# Patient Record
Sex: Female | Born: 1935 | Race: White | Hispanic: No | Marital: Married
Health system: Southern US, Community
[De-identification: ages and names within clinical notes are randomized; demographics above are authoritative.]

## PROBLEM LIST (undated history)

## (undated) DIAGNOSIS — R51 Headache: Secondary | ICD-10-CM

## (undated) DIAGNOSIS — E7800 Pure hypercholesterolemia, unspecified: Secondary | ICD-10-CM

## (undated) DIAGNOSIS — J302 Other seasonal allergic rhinitis: Secondary | ICD-10-CM

## (undated) DIAGNOSIS — Z973 Presence of spectacles and contact lenses: Secondary | ICD-10-CM

## (undated) DIAGNOSIS — C801 Malignant (primary) neoplasm, unspecified: Secondary | ICD-10-CM

## (undated) DIAGNOSIS — I10 Essential (primary) hypertension: Secondary | ICD-10-CM

## (undated) DIAGNOSIS — R519 Headache, unspecified: Secondary | ICD-10-CM

## (undated) DIAGNOSIS — M353 Polymyalgia rheumatica: Secondary | ICD-10-CM

## (undated) DIAGNOSIS — N189 Chronic kidney disease, unspecified: Secondary | ICD-10-CM

## (undated) DIAGNOSIS — H409 Unspecified glaucoma: Secondary | ICD-10-CM

## (undated) HISTORY — PX: JOINT REPLACEMENT: SHX530

## (undated) HISTORY — PX: FRACTURE SURGERY: SHX138

## (undated) HISTORY — PX: ABDOMINAL HYSTERECTOMY: SHX81

## (undated) HISTORY — PX: CATARACT EXTRACTION W/ INTRAOCULAR LENS  IMPLANT, BILATERAL: SHX1307

## (undated) HISTORY — PX: DILATION AND CURETTAGE OF UTERUS: SHX78

## (undated) HISTORY — PX: BREAST SURGERY: SHX581

## (undated) HISTORY — PX: CHOLECYSTECTOMY: SHX55

---

## 2005-02-27 ENCOUNTER — Ambulatory Visit: Payer: Self-pay | Admitting: Family Medicine

## 2005-06-10 ENCOUNTER — Ambulatory Visit: Payer: Self-pay | Admitting: Family Medicine

## 2005-09-04 ENCOUNTER — Ambulatory Visit: Payer: Self-pay | Admitting: Family Medicine

## 2014-07-19 ENCOUNTER — Other Ambulatory Visit (HOSPITAL_COMMUNITY): Payer: Self-pay | Admitting: Neurosurgery

## 2014-07-25 ENCOUNTER — Encounter (HOSPITAL_COMMUNITY): Payer: Self-pay | Admitting: Pharmacy Technician

## 2014-07-28 ENCOUNTER — Other Ambulatory Visit (HOSPITAL_COMMUNITY): Payer: Self-pay | Admitting: Neurosurgery

## 2014-07-28 ENCOUNTER — Encounter (HOSPITAL_COMMUNITY)
Admission: RE | Admit: 2014-07-28 | Discharge: 2014-07-28 | Disposition: A | Discharge status: Home / Self Care | Payer: Medicare PPO | Source: Ambulatory Visit | Attending: Neurosurgery | Admitting: Neurosurgery

## 2014-07-28 ENCOUNTER — Encounter (HOSPITAL_COMMUNITY): Payer: Self-pay

## 2014-07-28 DIAGNOSIS — I10 Essential (primary) hypertension: Secondary | ICD-10-CM | POA: Insufficient documentation

## 2014-07-28 DIAGNOSIS — C50919 Malignant neoplasm of unspecified site of unspecified female breast: Secondary | ICD-10-CM | POA: Diagnosis not present

## 2014-07-28 DIAGNOSIS — E780 Pure hypercholesterolemia: Secondary | ICD-10-CM | POA: Diagnosis not present

## 2014-07-28 DIAGNOSIS — H409 Unspecified glaucoma: Secondary | ICD-10-CM | POA: Diagnosis not present

## 2014-07-28 DIAGNOSIS — M353 Polymyalgia rheumatica: Secondary | ICD-10-CM | POA: Insufficient documentation

## 2014-07-28 DIAGNOSIS — N183 Chronic kidney disease, stage 3 (moderate): Secondary | ICD-10-CM | POA: Insufficient documentation

## 2014-07-28 DIAGNOSIS — Z01818 Encounter for other preprocedural examination: Secondary | ICD-10-CM | POA: Insufficient documentation

## 2014-07-28 HISTORY — DX: Malignant (primary) neoplasm, unspecified: C80.1

## 2014-07-28 HISTORY — DX: Chronic kidney disease, unspecified: N18.9

## 2014-07-28 HISTORY — DX: Other seasonal allergic rhinitis: J30.2

## 2014-07-28 HISTORY — DX: Essential (primary) hypertension: I10

## 2014-07-28 HISTORY — DX: Pure hypercholesterolemia, unspecified: E78.00

## 2014-07-28 HISTORY — DX: Presence of spectacles and contact lenses: Z97.3

## 2014-07-28 HISTORY — DX: Headache: R51

## 2014-07-28 HISTORY — DX: Headache, unspecified: R51.9

## 2014-07-28 HISTORY — DX: Unspecified glaucoma: H40.9

## 2014-07-28 HISTORY — DX: Polymyalgia rheumatica: M35.3

## 2014-07-28 LAB — SURGICAL PCR SCREEN
MRSA, PCR: POSITIVE — AB
Staphylococcus aureus: POSITIVE — AB

## 2014-07-28 LAB — BASIC METABOLIC PANEL
Anion gap: 12 (ref 5–15)
BUN: 26 mg/dL — AB (ref 6–23)
CALCIUM: 9.1 mg/dL (ref 8.4–10.5)
CO2: 26 mEq/L (ref 19–32)
Chloride: 102 mEq/L (ref 96–112)
Creatinine, Ser: 1.74 mg/dL — ABNORMAL HIGH (ref 0.50–1.10)
GFR calc Af Amer: 30 mL/min — ABNORMAL LOW (ref >90)
GFR calc non Af Amer: 26 mL/min — ABNORMAL LOW (ref >90)
GLUCOSE: 143 mg/dL — AB (ref 70–99)
Potassium: 4.8 mEq/L (ref 3.7–5.3)
Sodium: 140 mEq/L (ref 137–147)

## 2014-07-28 LAB — CBC
HEMATOCRIT: 36.4 % (ref 36.0–46.0)
Hemoglobin: 12 g/dL (ref 12.0–15.0)
MCH: 31.6 pg (ref 26.0–34.0)
MCHC: 33 g/dL (ref 30.0–36.0)
MCV: 95.8 fL (ref 78.0–100.0)
Platelets: 243 10*3/uL (ref 150–400)
RBC: 3.8 MIL/uL — ABNORMAL LOW (ref 3.87–5.11)
RDW: 13.7 % (ref 11.5–15.5)
WBC: 11.7 10*3/uL — AB (ref 4.0–10.5)

## 2014-07-28 NOTE — Pre-Procedure Instructions (Signed)
Danielle Mata  07/28/2014   Your procedure is scheduled on: Thursday, August 03, 2014  Report to [facility] Admitting at 11:30 AM.( per MD)  Call this number if you have problems the morning of surgery: [phone]   Remember:   Do not eat food or drink liquids after midnight Wednesday, August 02, 2014   Take these medicines the morning of surgery with A SIP OF WATER: tamoxifen (NOLVADEX), betaxolol (BETOPTIC-S) eye drops, if needed: traMADol [name]) for pain  Stop taking Aspirin, vitamins, and herbal medications. Do not take any NSAIDs ie: Ibuprofen, Advil, Naproxen or any medication containing Aspirin; stop 5 days prior to procedure ( Saturday, July 29, 2014)   Do not wear jewelry, make-up or nail polish.  Do not wear lotions, powders, or perfumes. You may not wear deodorant.  Do not shave 48 hours prior to surgery.   Do not bring valuables to the hospital.  [facility] is not responsible for any belongings or valuables.               Contacts, dentures or bridgework may not be worn into surgery.  Leave suitcase in the car. After surgery it may be brought to your room.  For patients admitted to the hospital, discharge time is determined by your treatment team.               Patients discharged the day of surgery will not be allowed to drive home.  Name and phone number of your driver:   Special Instructions:  Special Instructions:Special Instructions: [facility] - Preparing for Surgery  Before surgery, you can play an important role.  Because skin is not sterile, your skin needs to be as free of germs as possible.  You can reduce the number of germs on you skin by washing with CHG (chlorahexidine gluconate) soap before surgery.  CHG is an antiseptic cleaner which kills germs and bonds with the skin to continue killing germs even after washing.  Please DO NOT use if you have an allergy to CHG or antibacterial soaps.  If your skin becomes reddened/irritated  stop using the CHG and inform your nurse when you arrive at Short Stay.  Do not shave (including legs and underarms) for at least 48 hours prior to the first CHG shower.  You may shave your face.  Please follow these instructions carefully:   1.  Shower with CHG Soap the night before surgery and the morning of Surgery.  2.  If you choose to wash your hair, wash your hair first as usual with your normal shampoo.  3.  After you shampoo, rinse your hair and body thoroughly to remove the Shampoo.  4.  Use CHG as you would any other liquid soap.  You can apply chg directly  to the skin and wash gently with scrungie or a clean washcloth.  5.  Apply the CHG Soap to your body ONLY FROM THE NECK DOWN.  Do not use on open wounds or open sores.  Avoid contact with your eyes, ears, mouth and genitals (private parts).  Wash genitals (private parts) with your normal soap.  6.  Wash thoroughly, paying special attention to the area where your surgery will be performed.  7.  Thoroughly rinse your body with warm water from the neck down.  8.  DO NOT shower/wash with your normal soap after using and rinsing off the CHG Soap.  9.  Pat yourself dry with a clean towel.  10.  Wear clean pajamas.            11.  Place clean sheets on your bed the night of your first shower and do not sleep with pets.  Day of Surgery  Do not apply any lotions/deodorants the morning of surgery.  Please wear clean clothes to the hospital/surgery center.   Please read over the following fact sheets that you were given: Pain Booklet, Coughing and Deep Breathing, MRSA Information and Surgical Site Infection Prevention

## 2014-07-28 NOTE — Progress Notes (Signed)
Spoke with Manuela Schwartz at Dr. Arnoldo Morale office to clarify order for consent. According to Manuela Schwartz, a new order will be entered.

## 2014-07-31 NOTE — Progress Notes (Addendum)
Anesthesia Chart Review:  Pt is 80 year old female scheduled for R L5-S1 diskectomy with L2-3, L 3-4, L4-5 laminectomy and foraminotomy on 08/03/2014 with Dr. Arnoldo Morale.   PMH: HTN, hypercholesterolemia, polymyalgia rheumatica, breast cancer, glaucoma  Medications include: valsartan/hctz, betaxolol, tamoxifen, prednisone, pravastatin  Preoperative labs reviewed.  BUN 26/Cr1.74.   EKG: Normal sinus rhythm Minimal voltage criteria for LVH, may be normal variant  Attempting to get records from PCP, Dr. Teressa Lower in [place].   Willeen Cass, FNP-BC [facility]/Anesthesiology Phone: [phone] 07/31/2014 3:57 PM  Addendum: Called again and received records from Dr. Garlon Hatchet this morning.  Cr on 06/20/14 was 1.5, BUN 35.  His 03/22/14 office note lists known history of CKD stage III.  Since patient with known CKD, I will not plan to repeat any preoperative labs.  History updated.  [facility] Short Stay Center/Anesthesiology Phone [phone] 08/02/2014 10:44 AM

## 2014-08-02 ENCOUNTER — Encounter (HOSPITAL_COMMUNITY): Payer: Self-pay

## 2014-08-02 MED ORDER — CEFAZOLIN SODIUM-DEXTROSE 2-3 GM-% IV SOLR
2.0000 g | INTRAVENOUS | Status: AC
  Administered 2014-08-03: 2 g via INTRAVENOUS
  Filled 2014-08-02: qty 50

## 2014-08-03 ENCOUNTER — Encounter (HOSPITAL_COMMUNITY): Payer: Medicare PPO | Admitting: Emergency Medicine

## 2014-08-03 ENCOUNTER — Encounter (HOSPITAL_COMMUNITY): Payer: Self-pay | Admitting: *Deleted

## 2014-08-03 ENCOUNTER — Inpatient Hospital Stay (HOSPITAL_COMMUNITY): Payer: Medicare PPO | Admitting: Anesthesiology

## 2014-08-03 ENCOUNTER — Encounter (HOSPITAL_COMMUNITY)
Admission: RE | Disposition: A | Discharge status: Home / Self Care | Payer: Self-pay | Source: Ambulatory Visit | Attending: Neurosurgery

## 2014-08-03 ENCOUNTER — Inpatient Hospital Stay (HOSPITAL_COMMUNITY)
Admission: RE | Admit: 2014-08-03 | Discharge: 2014-08-05 | DRG: 517 | Disposition: A | Discharge status: Home / Self Care | Payer: Medicare PPO | Source: Ambulatory Visit | Attending: Neurosurgery | Admitting: Neurosurgery

## 2014-08-03 ENCOUNTER — Inpatient Hospital Stay (HOSPITAL_COMMUNITY): Payer: Medicare PPO

## 2014-08-03 DIAGNOSIS — M549 Dorsalgia, unspecified: Secondary | ICD-10-CM | POA: Diagnosis present

## 2014-08-03 DIAGNOSIS — H409 Unspecified glaucoma: Secondary | ICD-10-CM | POA: Diagnosis present

## 2014-08-03 DIAGNOSIS — E780 Pure hypercholesterolemia: Secondary | ICD-10-CM | POA: Diagnosis present

## 2014-08-03 DIAGNOSIS — I129 Hypertensive chronic kidney disease with stage 1 through stage 4 chronic kidney disease, or unspecified chronic kidney disease: Secondary | ICD-10-CM | POA: Diagnosis present

## 2014-08-03 DIAGNOSIS — Z96653 Presence of artificial knee joint, bilateral: Secondary | ICD-10-CM

## 2014-08-03 DIAGNOSIS — M4806 Spinal stenosis, lumbar region: Principal | ICD-10-CM | POA: Diagnosis present

## 2014-08-03 DIAGNOSIS — Z7952 Long term (current) use of systemic steroids: Secondary | ICD-10-CM

## 2014-08-03 DIAGNOSIS — M5126 Other intervertebral disc displacement, lumbar region: Secondary | ICD-10-CM

## 2014-08-03 DIAGNOSIS — Z79899 Other long term (current) drug therapy: Secondary | ICD-10-CM | POA: Diagnosis not present

## 2014-08-03 DIAGNOSIS — Z853 Personal history of malignant neoplasm of breast: Secondary | ICD-10-CM

## 2014-08-03 DIAGNOSIS — M353 Polymyalgia rheumatica: Secondary | ICD-10-CM | POA: Diagnosis present

## 2014-08-03 DIAGNOSIS — M48062 Spinal stenosis, lumbar region with neurogenic claudication: Secondary | ICD-10-CM | POA: Diagnosis present

## 2014-08-03 DIAGNOSIS — N183 Chronic kidney disease, stage 3 (moderate): Secondary | ICD-10-CM | POA: Diagnosis present

## 2014-08-03 HISTORY — PX: LUMBAR LAMINECTOMY/DECOMPRESSION MICRODISCECTOMY: SHX5026

## 2014-08-03 SURGERY — LUMBAR LAMINECTOMY/DECOMPRESSION MICRODISCECTOMY 4 LEVEL
Anesthesia: General | Laterality: Right

## 2014-08-03 MED ORDER — ONDANSETRON HCL 4 MG/2ML IJ SOLN
INTRAMUSCULAR | Status: AC
  Filled 2014-08-03: qty 4

## 2014-08-03 MED ORDER — FENTANYL CITRATE 0.05 MG/ML IJ SOLN
INTRAMUSCULAR | Status: AC
  Administered 2014-08-03: 50 ug via INTRAVENOUS
  Filled 2014-08-03: qty 2

## 2014-08-03 MED ORDER — NEOSTIGMINE METHYLSULFATE 10 MG/10ML IV SOLN
INTRAVENOUS | Status: AC
  Filled 2014-08-03: qty 1

## 2014-08-03 MED ORDER — GLYCOPYRROLATE 0.2 MG/ML IJ SOLN
INTRAMUSCULAR | Status: AC
  Filled 2014-08-03: qty 3

## 2014-08-03 MED ORDER — CEFAZOLIN SODIUM-DEXTROSE 2-3 GM-% IV SOLR
2.0000 g | Freq: Three times a day (TID) | INTRAVENOUS | Status: DC
  Administered 2014-08-03 – 2014-08-05 (×5): 2 g via INTRAVENOUS
  Filled 2014-08-03 (×8): qty 50

## 2014-08-03 MED ORDER — PHENYLEPHRINE HCL 10 MG/ML IJ SOLN
10.0000 mg | INTRAVENOUS | Status: DC | PRN
  Administered 2014-08-03: 50 ug/min via INTRAVENOUS

## 2014-08-03 MED ORDER — HYDROCODONE-ACETAMINOPHEN 5-325 MG PO TABS: 1.0000 | ORAL_TABLET | ORAL | Status: DC | PRN

## 2014-08-03 MED ORDER — DIAZEPAM 5 MG PO TABS
ORAL_TABLET | ORAL | Status: AC
  Administered 2014-08-03: 5 mg via ORAL
  Filled 2014-08-03: qty 1

## 2014-08-03 MED ORDER — PROPOFOL 10 MG/ML IV BOLUS
INTRAVENOUS | Status: AC
  Filled 2014-08-03: qty 20

## 2014-08-03 MED ORDER — DIAZEPAM 5 MG PO TABS
5.0000 mg | ORAL_TABLET | Freq: Four times a day (QID) | ORAL | Status: DC | PRN
  Administered 2014-08-03: 5 mg via ORAL

## 2014-08-03 MED ORDER — TAMOXIFEN CITRATE 10 MG PO TABS
20.0000 mg | ORAL_TABLET | Freq: Every day | ORAL | Status: DC
  Administered 2014-08-04 – 2014-08-05 (×2): 20 mg via ORAL
  Filled 2014-08-03 (×2): qty 2

## 2014-08-03 MED ORDER — OXYCODONE-ACETAMINOPHEN 5-325 MG PO TABS
ORAL_TABLET | ORAL | Status: AC
  Administered 2014-08-03: 1 via ORAL
  Filled 2014-08-03: qty 1

## 2014-08-03 MED ORDER — BUPIVACAINE LIPOSOME 1.3 % IJ SUSP
INTRAMUSCULAR | Status: DC | PRN
  Administered 2014-08-03: 20 mL

## 2014-08-03 MED ORDER — GLYCOPYRROLATE 0.2 MG/ML IJ SOLN
INTRAMUSCULAR | Status: AC
  Filled 2014-08-03: qty 1

## 2014-08-03 MED ORDER — NEOSTIGMINE METHYLSULFATE 10 MG/10ML IV SOLN
INTRAVENOUS | Status: DC | PRN
  Administered 2014-08-03: 3 mg via INTRAVENOUS

## 2014-08-03 MED ORDER — ALUM & MAG HYDROXIDE-SIMETH 200-200-20 MG/5ML PO SUSP: 30.0000 mL | Freq: Four times a day (QID) | ORAL | Status: DC | PRN

## 2014-08-03 MED ORDER — FENTANYL CITRATE 0.05 MG/ML IJ SOLN
25.0000 ug | INTRAMUSCULAR | Status: DC | PRN
Start: 2014-08-03 — End: 2014-08-03
  Administered 2014-08-03 (×3): 50 ug via INTRAVENOUS

## 2014-08-03 MED ORDER — ROCURONIUM BROMIDE 50 MG/5ML IV SOLN
INTRAVENOUS | Status: AC
  Filled 2014-08-03: qty 1

## 2014-08-03 MED ORDER — SODIUM CHLORIDE 0.9 % IR SOLN
Status: DC | PRN
  Administered 2014-08-03: 16:00:00

## 2014-08-03 MED ORDER — BACITRACIN ZINC 500 UNIT/GM EX OINT
TOPICAL_OINTMENT | CUTANEOUS | Status: DC | PRN
  Administered 2014-08-03: 1 via TOPICAL

## 2014-08-03 MED ORDER — PREDNISONE 5 MG PO TABS
10.0000 mg | ORAL_TABLET | Freq: Every day | ORAL | Status: DC
  Administered 2014-08-04 – 2014-08-05 (×2): 10 mg via ORAL
  Filled 2014-08-03 (×2): qty 2

## 2014-08-03 MED ORDER — BETAXOLOL HCL 0.25 % OP SUSP
1.0000 [drp] | Freq: Two times a day (BID) | OPHTHALMIC | Status: DC
  Administered 2014-08-03 – 2014-08-05 (×4): 1 [drp] via OPHTHALMIC
  Filled 2014-08-03 (×2): qty 10

## 2014-08-03 MED ORDER — DOCUSATE SODIUM 100 MG PO CAPS
100.0000 mg | ORAL_CAPSULE | Freq: Two times a day (BID) | ORAL | Status: DC
  Administered 2014-08-03 – 2014-08-05 (×4): 100 mg via ORAL
  Filled 2014-08-03 (×4): qty 1

## 2014-08-03 MED ORDER — VALSARTAN-HYDROCHLOROTHIAZIDE 320-12.5 MG PO TABS: 1.0000 | ORAL_TABLET | Freq: Every day | ORAL | Status: DC

## 2014-08-03 MED ORDER — ONDANSETRON HCL 4 MG/2ML IJ SOLN: 4.0000 mg | Freq: Once | INTRAMUSCULAR | Status: DC | PRN

## 2014-08-03 MED ORDER — ACETAMINOPHEN 325 MG PO TABS: 650.0000 mg | ORAL_TABLET | ORAL | Status: DC | PRN

## 2014-08-03 MED ORDER — HYDROCHLOROTHIAZIDE 12.5 MG PO CAPS
12.5000 mg | ORAL_CAPSULE | Freq: Every day | ORAL | Status: DC
  Administered 2014-08-03 – 2014-08-05 (×3): 12.5 mg via ORAL
  Filled 2014-08-03 (×4): qty 1

## 2014-08-03 MED ORDER — PHENYLEPHRINE 40 MCG/ML (10ML) SYRINGE FOR IV PUSH (FOR BLOOD PRESSURE SUPPORT)
PREFILLED_SYRINGE | INTRAVENOUS | Status: AC
  Filled 2014-08-03: qty 10

## 2014-08-03 MED ORDER — MENTHOL 3 MG MT LOZG: 1.0000 | LOZENGE | OROMUCOSAL | Status: DC | PRN

## 2014-08-03 MED ORDER — LIDOCAINE HCL (CARDIAC) 20 MG/ML IV SOLN
INTRAVENOUS | Status: DC | PRN
  Administered 2014-08-03: 50 mg via INTRAVENOUS

## 2014-08-03 MED ORDER — ROCURONIUM BROMIDE 100 MG/10ML IV SOLN
INTRAVENOUS | Status: DC | PRN
  Administered 2014-08-03: 40 mg via INTRAVENOUS

## 2014-08-03 MED ORDER — HYDROCORTISONE NA SUCCINATE PF 100 MG IJ SOLR
INTRAMUSCULAR | Status: AC
  Filled 2014-08-03: qty 2

## 2014-08-03 MED ORDER — BUPIVACAINE-EPINEPHRINE (PF) 0.5% -1:200000 IJ SOLN
INTRAMUSCULAR | Status: DC | PRN
  Administered 2014-08-03: 10 mL

## 2014-08-03 MED ORDER — 0.9 % SODIUM CHLORIDE (POUR BTL) OPTIME
TOPICAL | Status: DC | PRN
  Administered 2014-08-03: 1000 mL

## 2014-08-03 MED ORDER — ONDANSETRON HCL 4 MG/2ML IJ SOLN
INTRAMUSCULAR | Status: DC | PRN
  Administered 2014-08-03: 4 mg via INTRAVENOUS

## 2014-08-03 MED ORDER — GLYCOPYRROLATE 0.2 MG/ML IJ SOLN
INTRAMUSCULAR | Status: DC | PRN
  Administered 2014-08-03: 0.4 mg via INTRAVENOUS
  Administered 2014-08-03 (×2): 0.1 mg via INTRAVENOUS

## 2014-08-03 MED ORDER — ARTIFICIAL TEARS OP OINT
TOPICAL_OINTMENT | OPHTHALMIC | Status: AC
  Filled 2014-08-03: qty 3.5

## 2014-08-03 MED ORDER — HYDROCORTISONE NA SUCCINATE PF 100 MG IJ SOLR
INTRAMUSCULAR | Status: DC | PRN
  Administered 2014-08-03: 100 mg via INTRAVENOUS

## 2014-08-03 MED ORDER — PHENOL 1.4 % MT LIQD: 1.0000 | OROMUCOSAL | Status: DC | PRN

## 2014-08-03 MED ORDER — THROMBIN 20000 UNITS EX SOLR
CUTANEOUS | Status: DC | PRN
  Administered 2014-08-03: 16:00:00 via TOPICAL

## 2014-08-03 MED ORDER — LACTATED RINGERS IV SOLN
INTRAVENOUS | Status: DC
  Administered 2014-08-03: 21:00:00 via INTRAVENOUS

## 2014-08-03 MED ORDER — OXYCODONE-ACETAMINOPHEN 5-325 MG PO TABS
1.0000 | ORAL_TABLET | ORAL | Status: DC | PRN
  Administered 2014-08-03: 1 via ORAL
  Administered 2014-08-04 – 2014-08-05 (×6): 2 via ORAL
  Filled 2014-08-03 (×6): qty 2

## 2014-08-03 MED ORDER — BUPIVACAINE LIPOSOME 1.3 % IJ SUSP
20.0000 mL | INTRAMUSCULAR | Status: AC
  Filled 2014-08-03: qty 20

## 2014-08-03 MED ORDER — PHENYLEPHRINE HCL 10 MG/ML IJ SOLN
INTRAMUSCULAR | Status: DC | PRN
  Administered 2014-08-03: 120 ug via INTRAVENOUS
  Administered 2014-08-03 (×4): 80 ug via INTRAVENOUS
  Administered 2014-08-03: 120 ug via INTRAVENOUS

## 2014-08-03 MED ORDER — ONDANSETRON HCL 4 MG/2ML IJ SOLN: 4.0000 mg | INTRAMUSCULAR | Status: DC | PRN

## 2014-08-03 MED ORDER — FENTANYL CITRATE 0.05 MG/ML IJ SOLN
INTRAMUSCULAR | Status: AC
  Filled 2014-08-03: qty 5

## 2014-08-03 MED ORDER — PRAVASTATIN SODIUM 80 MG PO TABS
80.0000 mg | ORAL_TABLET | Freq: Every day | ORAL | Status: DC
  Administered 2014-08-03 – 2014-08-04 (×2): 80 mg via ORAL
  Filled 2014-08-03 (×3): qty 1

## 2014-08-03 MED ORDER — MORPHINE SULFATE 2 MG/ML IJ SOLN
1.0000 mg | INTRAMUSCULAR | Status: DC | PRN
  Administered 2014-08-03: 2 mg via INTRAVENOUS
  Filled 2014-08-03: qty 1

## 2014-08-03 MED ORDER — IRBESARTAN 300 MG PO TABS
300.0000 mg | ORAL_TABLET | Freq: Every day | ORAL | Status: DC
  Administered 2014-08-03 – 2014-08-05 (×3): 300 mg via ORAL
  Filled 2014-08-03 (×3): qty 1

## 2014-08-03 MED ORDER — LACTATED RINGERS IV SOLN
INTRAVENOUS | Status: DC
  Administered 2014-08-03: 12:00:00 via INTRAVENOUS

## 2014-08-03 MED ORDER — LACTATED RINGERS IV SOLN
INTRAVENOUS | Status: DC | PRN
  Administered 2014-08-03 (×2): via INTRAVENOUS

## 2014-08-03 MED ORDER — FENTANYL CITRATE 0.05 MG/ML IJ SOLN
INTRAMUSCULAR | Status: DC | PRN
  Administered 2014-08-03: 75 ug via INTRAVENOUS
  Administered 2014-08-03: 50 ug via INTRAVENOUS
  Administered 2014-08-03: 75 ug via INTRAVENOUS
  Administered 2014-08-03: 50 ug via INTRAVENOUS

## 2014-08-03 MED ORDER — ACETAMINOPHEN 650 MG RE SUPP
650.0000 mg | RECTAL | Status: DC | PRN
Start: 2014-08-03 — End: 2014-08-05

## 2014-08-03 MED ORDER — PROPOFOL 10 MG/ML IV BOLUS
INTRAVENOUS | Status: DC | PRN
  Administered 2014-08-03: 30 mg via INTRAVENOUS
  Administered 2014-08-03: 150 mg via INTRAVENOUS

## 2014-08-03 SURGICAL SUPPLY — 55 items
BAG DECANTER FOR FLEXI CONT (MISCELLANEOUS) ×3 IMPLANT
BENZOIN TINCTURE PRP APPL 2/3 (GAUZE/BANDAGES/DRESSINGS) ×3 IMPLANT
BLADE CLIPPER SURG (BLADE) IMPLANT
BRUSH SCRUB EZ PLAIN DRY (MISCELLANEOUS) ×3 IMPLANT
BUR MATCHSTICK NEURO 3.0 LAGG (BURR) ×3 IMPLANT
BUR PRECISION FLUTE 6.0 (BURR) ×3 IMPLANT
CANISTER SUCT 3000ML (MISCELLANEOUS) ×3 IMPLANT
CLOSURE WOUND 1/2 X4 (GAUZE/BANDAGES/DRESSINGS) ×1
CONT SPEC 4OZ CLIKSEAL STRL BL (MISCELLANEOUS) ×3 IMPLANT
DRAPE LAPAROTOMY 100X72X124 (DRAPES) ×3 IMPLANT
DRAPE MICROSCOPE LEICA (MISCELLANEOUS) ×3 IMPLANT
DRAPE POUCH INSTRU U-SHP 10X18 (DRAPES) ×3 IMPLANT
DRAPE SURG 17X23 STRL (DRAPES) ×12 IMPLANT
ELECT BLADE 4.0 EZ CLEAN MEGAD (MISCELLANEOUS) ×3
ELECT REM PT RETURN 9FT ADLT (ELECTROSURGICAL) ×3
ELECTRODE BLDE 4.0 EZ CLN MEGD (MISCELLANEOUS) ×1 IMPLANT
ELECTRODE REM PT RTRN 9FT ADLT (ELECTROSURGICAL) ×1 IMPLANT
EVACUATOR 1/8 PVC DRAIN (DRAIN) ×3 IMPLANT
GAUZE SPONGE 4X4 12PLY STRL (GAUZE/BANDAGES/DRESSINGS) ×3 IMPLANT
GAUZE SPONGE 4X4 16PLY XRAY LF (GAUZE/BANDAGES/DRESSINGS) IMPLANT
GLOVE BIO SURGEON STRL SZ8.5 (GLOVE) ×3 IMPLANT
GLOVE BIOGEL PI IND STRL 7.0 (GLOVE) ×3 IMPLANT
GLOVE BIOGEL PI INDICATOR 7.0 (GLOVE) ×6
GLOVE EXAM NITRILE LRG STRL (GLOVE) IMPLANT
GLOVE EXAM NITRILE MD LF STRL (GLOVE) IMPLANT
GLOVE EXAM NITRILE XL STR (GLOVE) IMPLANT
GLOVE EXAM NITRILE XS STR PU (GLOVE) IMPLANT
GLOVE SS BIOGEL STRL SZ 6.5 (GLOVE) ×3 IMPLANT
GLOVE SS BIOGEL STRL SZ 8 (GLOVE) ×2 IMPLANT
GLOVE SUPERSENSE BIOGEL SZ 6.5 (GLOVE) ×6
GLOVE SUPERSENSE BIOGEL SZ 8 (GLOVE) ×4
GOWN STRL REUS W/ TWL LRG LVL3 (GOWN DISPOSABLE) ×1 IMPLANT
GOWN STRL REUS W/ TWL XL LVL3 (GOWN DISPOSABLE) ×3 IMPLANT
GOWN STRL REUS W/TWL 2XL LVL3 (GOWN DISPOSABLE) IMPLANT
GOWN STRL REUS W/TWL LRG LVL3 (GOWN DISPOSABLE) ×2
GOWN STRL REUS W/TWL XL LVL3 (GOWN DISPOSABLE) ×6
KIT BASIN OR (CUSTOM PROCEDURE TRAY) ×3 IMPLANT
KIT ROOM TURNOVER OR (KITS) ×3 IMPLANT
NEEDLE HYPO 21X1.5 SAFETY (NEEDLE) IMPLANT
NEEDLE HYPO 22GX1.5 SAFETY (NEEDLE) ×3 IMPLANT
NS IRRIG 1000ML POUR BTL (IV SOLUTION) ×3 IMPLANT
PACK LAMINECTOMY NEURO (CUSTOM PROCEDURE TRAY) ×3 IMPLANT
PAD ARMBOARD 7.5X6 YLW CONV (MISCELLANEOUS) ×9 IMPLANT
PATTIES SURGICAL .5 X1 (DISPOSABLE) IMPLANT
RUBBERBAND STERILE (MISCELLANEOUS) ×6 IMPLANT
SPONGE SURGIFOAM ABS GEL 100 (HEMOSTASIS) ×3 IMPLANT
STRIP CLOSURE SKIN 1/2X4 (GAUZE/BANDAGES/DRESSINGS) ×2 IMPLANT
SUT VIC AB 1 CT1 18XBRD ANBCTR (SUTURE) ×2 IMPLANT
SUT VIC AB 1 CT1 8-18 (SUTURE) ×4
SUT VIC AB 2-0 CP2 18 (SUTURE) ×6 IMPLANT
SYR 20CC LL (SYRINGE) IMPLANT
SYR 20ML ECCENTRIC (SYRINGE) ×3 IMPLANT
TOWEL OR 17X24 6PK STRL BLUE (TOWEL DISPOSABLE) ×3 IMPLANT
TOWEL OR 17X26 10 PK STRL BLUE (TOWEL DISPOSABLE) ×3 IMPLANT
WATER STERILE IRR 1000ML POUR (IV SOLUTION) ×3 IMPLANT

## 2014-08-03 NOTE — Anesthesia Preprocedure Evaluation (Signed)
Anesthesia Evaluation  Patient identified by MRN, date of birth, ID band Patient awake, Patient confused and Patient unresponsive    Reviewed: Allergy & Precautions, H&P , NPO status , Patient's Chart, lab work & pertinent test results  Airway Mallampati: II  TM Distance: >3 FB Neck ROM: Full    Dental  (+) Teeth Intact, Dental Advisory Given   Pulmonary  breath sounds clear to auscultation        Cardiovascular hypertension, Rhythm:Regular Rate:Normal     Neuro/Psych    GI/Hepatic   Endo/Other    Renal/GU      Musculoskeletal   Abdominal   Peds  Hematology   Anesthesia Other Findings   Reproductive/Obstetrics                             Anesthesia Physical Anesthesia Plan  ASA: III  Anesthesia Plan: General   Post-op Pain Management:    Induction: Intravenous  Airway Management Planned: Oral ETT  Additional Equipment:   Intra-op Plan:   Post-operative Plan: Extubation in OR  Informed Consent: I have reviewed the patients History and Physical, chart, labs and discussed the procedure including the risks, benefits and alternatives for the proposed anesthesia with the patient or authorized representative who has indicated his/her understanding and acceptance.   Dental advisory given  Plan Discussed with: CRNA and Anesthesiologist  Anesthesia Plan Comments: (Lumbar DDD Hypertension Renal insufficiency BUN/Cr. 26/1.74 H/O Breast Ca Polymyalgia rheumatica on chronic prednisone  Plan GA with oral ETT and peri-operative steroid coverage.  Roberts Gaudy)        Anesthesia Quick Evaluation

## 2014-08-03 NOTE — H&P (Signed)
Subjective: The patient is an 80 year old white female who has complained of back and right leg pain consistent with neurogenic claudication. She has failed medical management was worked up with a lumbar MRI which demonstrated spinal stenosis at L2-3, L3-4 and L4-5 with a right-sided herniated disc at L5-S1. I discussed the various treatment options with the patient including surgery. She has weighed the risks, benefits, and alternative surgery and decided proceed with a lumbar laminectomy and discectomy.   Past Medical History  Diagnosis Date  . Hypertension   . Hypercholesterolemia   . Wears glasses   . Seasonal allergies   . Headache     PMH: Migraines  . Polymyalgia rheumatica   . Cancer     Right breast  . Glaucoma   . Chronic kidney disease     CKD, stage III 03/2014 (Dr. Garlon Hatchet)    Past Surgical History  Procedure Laterality Date  . Dilation and curettage of uterus    . Abdominal hysterectomy    . Cholecystectomy    . Fracture surgery      Left hip  . Joint replacement      B/L knees  . Breast surgery    . Cataract extraction w/ intraocular lens  implant, bilateral      No Known Allergies  History  Substance Use Topics  . Smoking status: Never Smoker   . Smokeless tobacco: Never Used  . Alcohol Use: No    Family History  Problem Relation Age of Onset  . Arthritis Mother    Prior to Admission medications   Medication Sig Start Date End Date Taking? Authorizing Provider  betaxolol (BETOPTIC-S) 0.25 % ophthalmic suspension Place 1 drop into both eyes 2 (two) times daily.    Yes Historical Provider, MD  CALCIUM PO Take 1 tablet by mouth 2 (two) times daily.   Yes Historical Provider, MD  Omega-3 Fatty Acids (FISH OIL PO) Take 1 tablet by mouth 2 (two) times daily.   Yes Historical Provider, MD  pravastatin (PRAVACHOL) 80 MG tablet Take 80 mg by mouth at bedtime.   Yes Historical Provider, MD  predniSONE (DELTASONE) 10 MG tablet Take 10 mg by mouth daily with  breakfast.   Yes Historical Provider, MD  tamoxifen (NOLVADEX) 20 MG tablet Take 20 mg by mouth daily.   Yes Historical Provider, MD  traMADol (ULTRAM) 50 MG tablet Take 50 mg by mouth every 6 (six) hours as needed (for pain).   Yes Historical Provider, MD  valsartan-hydrochlorothiazide (DIOVAN-HCT) 320-12.5 MG per tablet Take 1 tablet by mouth daily.   Yes Historical Provider, MD     Review of Systems  Positive ROS: As above  All other systems have been reviewed and were otherwise negative with the exception of those mentioned in the HPI and as above.  Objective: Vital signs in last 24 hours: Temp:  [97.8 F (36.6 C)] 97.8 F (36.6 C) (10/29 1134) Pulse Rate:  [91] 91 (10/29 1134) Resp:  [20] 20 (10/29 1134) BP: (142)/(46) 142/46 mmHg (10/29 1134) SpO2:  [99 %] 99 % (10/29 1134) Weight:  [103.874 kg (229 lb)] 103.874 kg (229 lb) (10/29 1134)  General Appearance: Alert, cooperative, no distress, Head: Normocephalic, without obvious abnormality, atraumatic Eyes: PERRL, conjunctiva/corneas clear, EOM's intact,    Ears: Normal  Throat: Normal  Neck: Supple, symmetrical, trachea midline, no adenopathy; thyroid: No enlargement/tenderness/nodules; no carotid bruit or JVD Back: Symmetric, no curvature, ROM normal, no CVA tenderness Lungs: Clear to auscultation bilaterally, respirations unlabored Heart: Regular  rate and rhythm, no murmur, rub or gallop Abdomen: Soft, non-tender,, no masses, no organomegaly Extremities: Extremities normal, atraumatic, no cyanosis or edema Pulses: 2+ and symmetric all extremities Skin: Skin color, texture, turgor normal, no rashes or lesions  NEUROLOGIC:   Mental status: alert and oriented, no aphasia, good attention span, Fund of knowledge/ memory ok Motor Exam - grossly normal Sensory Exam - grossly normal Reflexes:  Coordination - grossly normal Gait - grossly normal Balance - grossly normal Cranial Nerves: I: smell Not tested  II: visual  acuity  OS: Normal  OD: Normal   II: visual fields Full to confrontation  II: pupils Equal, round, reactive to light  III,VII: ptosis None  III,IV,VI: extraocular muscles  Full ROM  V: mastication Normal  V: facial light touch sensation  Normal  V,VII: corneal reflex  Present  VII: facial muscle function - upper  Normal  VII: facial muscle function - lower Normal  VIII: hearing Not tested  IX: soft palate elevation  Normal  IX,X: gag reflex Present  XI: trapezius strength  5/5  XI: sternocleidomastoid strength 5/5  XI: neck flexion strength  5/5  XII: tongue strength  Normal    Data Review Lab Results  Component Value Date   WBC 11.7* 07/28/2014   HGB 12.0 07/28/2014   HCT 36.4 07/28/2014   MCV 95.8 07/28/2014   PLT 243 07/28/2014   Lab Results  Component Value Date   NA 140 07/28/2014   K 4.8 07/28/2014   CL 102 07/28/2014   CO2 26 07/28/2014   BUN 26* 07/28/2014   CREATININE 1.74* 07/28/2014   GLUCOSE 143* 07/28/2014   No results found for this basename: INR, PROTIME    Assessment/Plan: L2-3, L3-4, L4-5 spinal stenosis right L5-S1 herniated disc, lumbago, lumbar radiculopathy, neurogenic claudication: I have discussed the situation with the patient. I have reviewed her imaging studies with her and pointed out the abnormalities. We have discussed the various treatment options including surgery. I have described the surgical option of a L2-3, L3-4 and L4-5 laminectomy with a right L5-S1 discectomy. I have described the surgery to her. I have shown her surgical models. We have discussed the risks, benefits, alternatives, and likelihood of achieving her goals with surgery. I have answered all the patient's questions. She has decided to proceed with surgery.   Kristain Filo D 08/03/2014 1:44 PM

## 2014-08-03 NOTE — Transfer of Care (Signed)
Immediate Anesthesia Transfer of Care Note  Patient: Danielle Mata  Procedure(s) Performed: Procedure(s): Right Lumbar five/ Sacral one diskectomy with Lumbar two/three,three/four, four/five Laminectomy and Foraminotomy (Right)  Patient Location: PACU  Anesthesia Type:General  Level of Consciousness: awake, alert  and oriented  Airway & Oxygen Therapy: Patient Spontanous Breathing and Patient connected to nasal cannula oxygen  Post-op Assessment: Report given to PACU RN, Post -op Vital signs reviewed and stable and Patient moving all extremities X 4  Post vital signs: Reviewed and stable  Complications: No apparent anesthesia complications

## 2014-08-03 NOTE — Op Note (Signed)
Brief history: The patient is an 80 year old white female who has complained of back and right leg pain consistent with neurogenic claudication. She has failed medical management and was worked up with a lumbar MRI which demonstrated multilevel lumbar stenosis. I discussed the various treatment options with the patient including surgery. She has decided to proceed with surgery after weighing the risks, benefits, and alternative surgery.  Preoperative diagnosis: L2-3, L3-4, L4-5 and L5-S1 spinal stenosis, lumbago, lumbar radiculopathy, neurogenic claudication  Postoperative diagnosis: The same  Procedure: L3 and L4 laminectomy, bilateral L2 laminotomies, right L5 laminotomy to decompress the bilateral L3, L4, L5 and right S1 nerve root using microdissection   Surgeon: Dr. Earle Gell  Asst.: Dr. Erline Levine  Anesthesia: Gen. endotracheal  Estimated blood loss: 200 mL  Drains: One medium Hemovac in the epidural space  Complications: None  Description of procedure: The patient was brought to the operating room by the anesthesia team. General endotracheal anesthesia was induced. The patient was turned to the prone position on the Wilson frame. The patient's lumbosacral region was then prepared with Betadine scrub and Betadine solution. Sterile drapes were applied.  I then injected the area to be incised with Marcaine with epinephrine solution. I then used a scalpel to make a linear midline incision over the L2-3, L3-4, L4-5 and L5-S1 intervertebral disc space. I then used electrocautery to perform a bilateral sided subperiosteal dissection exposing the spinous process and lamina of L2 to the upper sacrum. We obtained intraoperative radiograph to confirm our location. I then inserted the [facility] retractor for exposure. I then incised the interspinous ligament at  L2-3, L3-4, and L4-5 with a scalpel. I uses Leksell rongeur the spinous processes at L L3 and L4.  We then brought the operative  microscope into the field. Under its magnification and illumination we completed the microdissection. I used a high-speed drill to perform a laminotomy at L2, L3, L4 bilaterally and at L5 on the right. I then used a Kerrison punches to complete the laminectomy at L3 and L4 and to widen the laminotomies at L2 bilaterally and L5 on the right and removed the ligamentum flavum at L2-3, L3-4, L4-5 bilaterally and L5-S1 on the right. We then used microdissection to free up the thecal sac and the bilateral L3, L4 and L5 and right S1 nerve root from the epidural tissue. I then used a Kerrison punch to perform a foraminotomy at about the bilateral L3, L4, L5 nerve roots and S1 on the right nerve root. We inspected the intervertebral disc at L2-3, L3-4, L4-5 bilaterally and L5-S1 on the right. There were no significant herniations.  I then palpated along the ventral surface of the thecal sac and along exit route of the bilateral L3, L4 and L5 and right S1 nerve root and noted that the neural structures were well decompressed. This completed the decompression.  We then obtained hemostasis using bipolar electrocautery. We irrigated the wound out with bacitracin solution. We then removed the retractor. I placed a medium Hemovac drain in the epidural space and tunneled it out through separate stab wound. We then reapproximated the patient's thoracolumbar fascia with interrupted #1 Vicryl suture. We then reapproximated the patient's subcutaneous tissue with interrupted 2-0 Vicryl suture. We then reapproximated patient's skin with Steri-Strips and benzoin. The was then coated with bacitracin ointment. The drapes were removed. The patient was subsequently returned to the supine position where they were extubated by the anesthesia team. The patient was then transported to the postanesthesia care  unit in stable condition. All sponge instrument and needle counts were reportedly correct at the end of this case.

## 2014-08-03 NOTE — Anesthesia Procedure Notes (Addendum)
Procedure Name: Intubation Date/Time: 08/03/2014 2:28 PM Performed by: Garner Nash Pre-anesthesia Checklist: Patient identified, Timeout performed, Emergency Drugs available, Suction available and Patient being monitored Patient Re-evaluated:Patient Re-evaluated prior to inductionOxygen Delivery Method: Circle system utilized Preoxygenation: Pre-oxygenation with 100% oxygen Intubation Type: IV induction Ventilation: Oral airway inserted - appropriate to patient size and Mask ventilation without difficulty Laryngoscope Size: Mac and 3 Grade View: Grade III Tube type: Oral Tube size: 7.0 mm Number of attempts: 1 Airway Equipment and Method: Stylet and Oral airway Placement Confirmation: ETT inserted through vocal cords under direct vision,  breath sounds checked- equal and bilateral and positive ETCO2 Secured at: 21 cm Tube secured with: Tape Dental Injury: Teeth and Oropharynx as per pre-operative assessment  Comments: Short chin- anterior airway. Easy mask 2 handed mask with oral airway.

## 2014-08-03 NOTE — Anesthesia Postprocedure Evaluation (Signed)
  Anesthesia Post-op Note  Patient: Danielle Mata  Procedure(s) Performed: Procedure(s): Right Lumbar five/ Sacral one diskectomy with Lumbar two/three,three/four, four/five Laminectomy and Foraminotomy (Right)  Patient Location: PACU  Anesthesia Type:General  Level of Consciousness: awake, alert  and oriented  Airway and Oxygen Therapy: Patient Spontanous Breathing and Patient connected to nasal cannula oxygen  Post-op Pain: mild  Post-op Assessment: Post-op Vital signs reviewed, Patient's Cardiovascular Status Stable, Respiratory Function Stable, Patent Airway, No signs of Nausea or vomiting and Pain level controlled  Post-op Vital Signs: stable  Last Vitals:  Filed Vitals:   08/03/14 1800  BP: 146/76  Pulse: 73  Temp:   Resp: 10    Complications: No apparent anesthesia complications

## 2014-08-03 NOTE — Progress Notes (Signed)
Subjective:  The patient is somnolent but easily arousable. She is in no apparent distress. She looks well.  Objective: Vital signs in last 24 hours: Temp:  [97.8 F (36.6 C)-98.7 F (37.1 C)] 98.7 F (37.1 C) (10/29 1726) Pulse Rate:  [75-91] 75 (10/29 1726) Resp:  [18-20] 18 (10/29 1726) BP: (135-142)/(46-62) 135/62 mmHg (10/29 1726) SpO2:  [97 %-99 %] 97 % (10/29 1726) Weight:  [103.874 kg (229 lb)] 103.874 kg (229 lb) (10/29 1134)  Intake/Output from previous day:   Intake/Output this shift: Total I/O In: 1600 [I.V.:1600] Out: 300 [Blood:300]  Physical exam the patient is somnolent but arousable. She is moving her lower extremities well.  Lab Results: No results found for this basename: WBC, HGB, HCT, PLT,  in the last 72 hours BMET No results found for this basename: NA, K, CL, CO2, GLUCOSE, BUN, CREATININE, CALCIUM,  in the last 72 hours  Studies/Results: Dg Lumbar Spine 1 View  08/03/2014   CLINICAL DATA:  Foraminotomy  EXAM: LUMBAR SPINE - 1 VIEW  COMPARISON:  None.  FINDINGS: The lowest disc is labeled L5-S1. A surgical instrument projects over the L3 inferior articular facet.  IMPRESSION: Intraoperative marking at the L3-4 disc.   Electronically Signed   By: Maryclare Bean M.D.   On: 08/03/2014 17:05    Assessment/Plan: The patient is doing well.  LOS: 0 days     Jesaiah Fabiano D 08/03/2014, 5:36 PM

## 2014-08-04 ENCOUNTER — Encounter (HOSPITAL_COMMUNITY): Payer: Self-pay | Admitting: Neurosurgery

## 2014-08-04 NOTE — Evaluation (Signed)
Physical Therapy Evaluation Patient Details Name: Danielle Mata MRN: [id number] DOB: [birth year] Today's Date: 08/04/2014   History of Present Illness  Patient is a 80 y/o female s/p L3-4 laminectomy, bilateral L2 laminotomies, right L5 laminotomy to decompress the bilateral L3, L4, L5 and right S1 nerve root using microdissection.    Clinical Impression  Patient presents with functional limitations s/p above surgery. Pt with pain, balance deficits and generalized weakness limiting safe mobility. Education provided on back precautions and hand out reviewed. Pt self limited ambulation distance today. Pt would benefit from acute Pt and follow up HHPT to improve transfers, gait, balance and overall mobility so pt can maximize independence and return to PLOF. Recommend supervision at home initially for safety.    Follow Up Recommendations Home health PT;Supervision/Assistance - 24 hour    Equipment Recommendations  None recommended by PT    Recommendations for Other Services OT consult     Precautions / Restrictions Precautions Precautions: Back;Fall Precaution Booklet Issued: Yes (comment) Restrictions Weight Bearing Restrictions: No      Mobility  Bed Mobility Overal bed mobility: Needs Assistance Bed Mobility: Rolling;Sidelying to Sit Rolling: Min guard Sidelying to sit: Min guard;HOB elevated       General bed mobility comments: VC's for technique and hand placement for log roll technique. Use of rails.  Transfers Overall transfer level: Needs assistance Equipment used: Rolling walker (2 wheeled) Transfers: Sit to/from Stand Sit to Stand: Min guard         General transfer comment: Min guard for safety/balance.  Ambulation/Gait Ambulation/Gait assistance: Min guard Ambulation Distance (Feet): 60 Feet Assistive device: Rolling walker (2 wheeled) Gait Pattern/deviations: Step-to pattern;Decreased stride length Gait velocity: slow   General Gait Details:  mildly unsteady gait pattern due to pain in RLE with WB with increased distance. Min guard for safety. Self limited ambulation distance. Ambulated to chair.  Stairs            Wheelchair Mobility    Modified Rankin (Stroke Patients Only)       Balance Overall balance assessment: Needs assistance   Sitting balance-Leahy Scale: Fair     Standing balance support: During functional activity Standing balance-Leahy Scale: Poor Standing balance comment: Requires BUE support with RW for balance/safety.                             Pertinent Vitals/Pain Pain Assessment: 0-10 Pain Score: 3  Pain Location: back and RLE Pain Descriptors / Indicators: Sore Pain Intervention(s): Limited activity within patient's tolerance;Monitored during session;Repositioned    Home Living Family/patient expects to be discharged to:: Private residence Living Arrangements: Alone Available Help at Discharge: Family;Available PRN/intermittently (Daughter lives down the road and is retired) Type of Home: House Home Access: Stairs to enter   Technical brewer of Steps: [address] [place]: One level;Laundry or work area in [place]: Environmental consultant - 2 wheels;Bedside commode;Wheelchair - [employer] - 4 wheels      Prior Function Level of Independence: Needs assistance   Gait / Transfers Assistance Needed: Pt using rollator for ambulation at all times.   ADL's / Homemaking Assistance Needed: Reports (I) with ADLs. Daughter assists with IADLs - driving, cooking, cleaning.        Hand Dominance        Extremity/Trunk Assessment   Upper Extremity Assessment: Overall WFL for tasks assessed           Lower Extremity Assessment: Generalized weakness  Communication   Communication: No difficulties  Cognition Arousal/Alertness: Awake/alert Behavior During Therapy: WFL for tasks assessed/performed Overall Cognitive Status: Within Functional Limits for tasks  assessed                      General Comments General comments (skin integrity, edema, etc.): Surgical site - clean, dry and intact.     Exercises General Exercises - Lower Extremity Ankle Circles/Pumps: Both;10 reps;Seated Long Arc Quad: Both;10 reps;Seated      Assessment/Plan    PT Assessment Patient needs continued PT services  PT Diagnosis Acute pain;Difficulty walking;Generalized weakness   PT Problem List Pain;Decreased strength;Decreased activity tolerance;Decreased balance;Decreased safety awareness;Decreased knowledge of precautions;Decreased mobility  PT Treatment Interventions Balance training;Gait training;Cognitive remediation;Patient/family education;Functional mobility training;Therapeutic activities;Therapeutic exercise   PT Goals (Current goals can be found in the Care Plan section) Acute Rehab PT Goals Patient Stated Goal: to get better so I can go home PT Goal Formulation: With patient Time For Goal Achievement: 08/18/14 Potential to Achieve Goals: Good    Frequency Min 5X/week   Barriers to discharge Decreased caregiver support Pt lives alone however reports daughter can be present for supervsion.    Co-evaluation               End of Session Equipment Utilized During Treatment: Gait belt Activity Tolerance: Patient limited by fatigue;Patient limited by pain Patient left: in chair;with call bell/phone within reach Nurse Communication: Mobility status;Precautions         Time: 9038-3338 PT Time Calculation (min): 21 min   Charges:   PT Evaluation $Initial PT Evaluation Tier I: 1 Procedure PT Treatments $Therapeutic Activity: 8-22 mins   PT G CodesCandy Sledge A 08/04/2014, 12:44 PM Candy Sledge, [place], DPT [phone]

## 2014-08-04 NOTE — Progress Notes (Signed)
CARE MANAGEMENT NOTE 08/04/2014  Patient:  Danielle Mata, Danielle Mata   Account Number:  [id number]  Date Initiated:  08/04/2014  Documentation initiated by:  Olga Coaster  Subjective/Objective Assessment:   ADMITTED FOR SURGERY     Action/Plan:   CM FOLLOWING FOR DCP   Anticipated DC Date:  08/07/2014   Anticipated DC Plan:  AWAITING FOR PT/OT EVALS FOR DISPOSITION NEEDS WITH ATTEDNING MD IN AGREEMENT     DC Planning Services  CM consult         Status of service:  In process, will continue to follow Medicare Important Message given?   (If response is "NO", the following Medicare IM given date fields will be blank)  Per UR Regulation:  Reviewed for med. necessity/level of care/duration of stay  Comments:  08/04/2014- B Earlyn Sylvan RN,BSN,MHA

## 2014-08-04 NOTE — Progress Notes (Signed)
Patient ID: Danielle Mata, female   DOB: 1928-07-11, 80 y.o.   MRN: [id number] Subjective:  The patient is alert and pleasant. She looks well. She is in no apparent distress.  Objective: Vital signs in last 24 hours: Temp:  [97.7 F (36.5 C)-98.7 F (37.1 C)] 98.3 F (36.8 C) (10/30 0556) Pulse Rate:  [70-91] 73 (10/30 0556) Resp:  [10-20] 18 (10/30 0556) BP: (118-159)/(46-77) 134/62 mmHg (10/30 0556) SpO2:  [97 %-100 %] 100 % (10/30 0556) Weight:  [103.874 kg (229 lb)] 103.874 kg (229 lb) (10/29 1134)  Intake/Output from previous day: 10/29 0701 - 10/30 0700 In: 1600 [I.V.:1600] Out: 300 [Blood:300] Intake/Output this shift:    Physical exam the patient is alert and oriented. She is moving her lower extremities well.  Lab Results: No results found for this basename: WBC, HGB, HCT, PLT,  in the last 72 hours BMET No results found for this basename: NA, K, CL, CO2, GLUCOSE, BUN, CREATININE, CALCIUM,  in the last 72 hours  Studies/Results: Dg Lumbar Spine 1 View  08/03/2014   CLINICAL DATA:  Foraminotomy  EXAM: LUMBAR SPINE - 1 VIEW  COMPARISON:  None.  FINDINGS: The lowest disc is labeled L5-S1. A surgical instrument projects over the L3 inferior articular facet.  IMPRESSION: Intraoperative marking at the L3-4 disc.   Electronically Signed   By: Maryclare Bean M.D.   On: 08/03/2014 17:05    Assessment/Plan: Postop day #1: The patient is doing well. She will likely go home this weekend. I gave her her discharge instructions and answered all her questions.  LOS: 1 day     Fayette Hamada D 08/04/2014, 7:54 AM

## 2014-08-05 MED ORDER — OXYCODONE-ACETAMINOPHEN 5-325 MG PO TABS: 1.0000 | ORAL_TABLET | ORAL | Status: AC | PRN

## 2014-08-05 NOTE — Progress Notes (Signed)
Patient to be DC. Recommendation for PT at home. CM notified by confidential voicemail

## 2014-08-05 NOTE — Care Management Note (Signed)
    Page 1 of 1   08/05/2014     1:03:02 PM CARE MANAGEMENT NOTE 08/05/2014  Patient:  CHERYLN, BALCOM   Account Number:  [id number]  Date Initiated:  08/04/2014  Documentation initiated by:  Olga Coaster  Subjective/Objective Assessment:   ADMITTED FOR SURGERY     Action/Plan:   CM FOLLOWING FOR DCP   Anticipated DC Date:  08/07/2014   Anticipated DC Plan:  [place]  CM consult      Choice offered to / List presented to:             Status of service:  Completed, signed off Medicare Important Message given?   (If response is "NO", the following Medicare IM given date fields will be blank) Date Medicare IM given:   Medicare IM given by:   Date Additional Medicare IM given:   Additional Medicare IM given by:    Discharge Disposition:  HOME/SELF CARE  Per UR Regulation:  Reviewed for med. necessity/level of care/duration of stay  If discussed at [place] of Stay Meetings, dates discussed:    Comments:  08/05/14 13:00 Cm received a call from Charge RN to inquire if orders were placed for HHPT bc it is mentioned in the DC summary.  CM researched and called Charge RN back bc orders and F2F are needed for HHPT; at this time Charge states she has discussed with pt and MD and pt has walker and MD no orders to be placed/needed.  No other CM neecds were communicated.  Mariane Masters, BSN, [place].  10/30/2015Mindi Slicker RN,BSN,MHA

## 2014-08-05 NOTE — Discharge Summary (Signed)
Physician Discharge Summary  Patient ID: Danielle Mata MRN: [id number] DOB/AGE: [birth year] 78 y.o.  Admit date: 08/03/2014 Discharge date: 08/05/2014  Admission Diagnoses:  L2-3, L3-4, L4-5 and L5-S1 spinal stenosis, lumbago, lumbar radiculopathy, neurogenic claudication  Discharge Diagnoses:  L2-3, L3-4, L4-5 and L5-S1 spinal stenosis, lumbago, lumbar radiculopathy, neurogenic claudication  Active Problems:   Lumbar stenosis with neurogenic claudication   Discharged Condition: good  Hospital Course: Patient was admitted by Dr. Arnoldo Morale, underwent a decompressive lumbar laminectomy. She is making good progress following surgery. She is up and ambulate actively. I changed her dressing today which had a moderate amount of bloody drainage, and she's been given further instructions regarding wound care by me today. She is also given instructions regarding activities by Arnoldo Morale, and she's return to see Dr. Arnoldo Morale in a few weeks.  Discharge Exam: Blood pressure 166/66, pulse 73, temperature 97.7 F (36.5 C), temperature source Oral, resp. rate 18, weight 103.874 kg (229 lb), SpO2 100.00%.  Disposition: Home     Medication List         betaxolol 0.25 % ophthalmic suspension  Commonly known as:  BETOPTIC-S  Place 1 drop into both eyes 2 (two) times daily.     CALCIUM PO  Take 1 tablet by mouth 2 (two) times daily.     FISH OIL PO  Take 1 tablet by mouth 2 (two) times daily.     oxyCODONE-acetaminophen 5-325 MG per tablet  Commonly known as:  PERCOCET/ROXICET  Take 1-2 tablets by mouth every 4 (four) hours as needed (pain).     pravastatin 80 MG tablet  Commonly known as:  PRAVACHOL  Take 80 mg by mouth at bedtime.     predniSONE 10 MG tablet  Commonly known as:  DELTASONE  Take 10 mg by mouth daily with breakfast.     tamoxifen 20 MG tablet  Commonly known as:  NOLVADEX  Take 20 mg by mouth daily.     traMADol 50 MG tablet  Commonly known as:  ULTRAM  Take 50  mg by mouth every 6 (six) hours as needed (for pain).     valsartan-hydrochlorothiazide 320-12.5 MG per tablet  Commonly known as:  DIOVAN-HCT  Take 1 tablet by mouth daily.         SignedHosie Spangle, MD 08/05/2014, 9:02 AM

## 2014-08-05 NOTE — Progress Notes (Signed)
Physical Therapy Treatment Patient Details Name: Danielle Mata MRN: [id number] DOB: 1927/12/07 Today's Date: 08/05/2014    History of Present Illness Patient is Danielle Mata 80 y/o female s/p L3-4 laminectomy, bilateral L2 laminotomies, right L5 laminotomy to decompress the bilateral L3, L4, L5 and right S1 nerve root using microdissection.     PT Comments    Patient progressing slowly with mobility. Exhibits decreased tolerance for exercise/activity due to pain in RLE with ambulation. Reviewed back precautions. Pt has difficulty with bed mobility and requires Mod Danielle Mata to bring BLEs to return to supine. Pt reports daughter will provide 24/7 supervision at home to help with transfers as needed. Pt dressed and ready to be discharged home. Will follow up if still in hospital.   Follow Up Recommendations  Home health PT;Supervision/Assistance - 24 hour     Equipment Recommendations  None recommended by PT    Recommendations for Other Services       Precautions / Restrictions Precautions Precautions: Back;Fall Restrictions Weight Bearing Restrictions: No    Mobility  Bed Mobility Overal bed mobility: Needs Assistance Bed Mobility: Rolling;Sidelying to Sit;Sit to Sidelying Rolling: Min guard Sidelying to sit: Min guard     Sit to sidelying: Mod assist General bed mobility comments: HOB flat, no rails to simulate home environment. Requires Mod Danielle Mata to bring BLEs into bed. VC's for log roll technique.  Transfers Overall transfer level: Needs assistance Equipment used: Rolling walker (2 wheeled) Transfers: Sit to/from Stand Sit to Stand: Min guard         General transfer comment: Min guard for safety/balance.  Ambulation/Gait Ambulation/Gait assistance: Min guard Ambulation Distance (Feet): 75 Feet Assistive device: Rolling walker (2 wheeled) Gait Pattern/deviations: Step-to pattern;Decreased stride length Gait velocity: slow   General Gait Details: More steady gait today  however limited due to pain in RLE with WB and with increased distance.    Stairs            Wheelchair Mobility    Modified Rankin (Stroke Patients Only)       Balance Overall balance assessment: Needs assistance Sitting-balance support: Feet supported Sitting balance-Leahy Scale: Fair     Standing balance support: During functional activity Standing balance-Leahy Scale: Poor Standing balance comment: Requires BUE support on RW for balance/safety.                    Cognition Arousal/Alertness: Awake/alert Behavior During Therapy: WFL for tasks assessed/performed Overall Cognitive Status: Within Functional Limits for tasks assessed       Memory:  (Able to recall precautions with verbal cues.)              Exercises      General Comments General comments (skin integrity, edema, etc.): Able to recall 3/3 precautions with cues and increased time.      Pertinent Vitals/Pain Pain Assessment: 0-10 Pain Score: 3  Pain Location: back and RLE Pain Descriptors / Indicators: Sore Pain Intervention(s): Limited activity within patient's tolerance;Monitored during session;Repositioned    Home Living                      Prior Function            PT Goals (current goals can now be found in the care plan section) Progress towards PT goals: Progressing toward goals    Frequency  Min 5X/week    PT Plan Current plan remains appropriate    Co-evaluation  End of Session Equipment Utilized During Treatment: Gait belt Activity Tolerance: Patient limited by pain;Patient limited by fatigue Patient left: in chair;with call bell/phone within reach     Time: 1354-1405 PT Time Calculation (min): 11 min  Charges:  $Therapeutic Activity: 8-22 mins                    G CodesCandy Mata Danielle Mata 2014-08-25, 2:10 PM Danielle Mata, [place], DPT [phone]

## 2014-08-05 NOTE — Progress Notes (Signed)
Discharge instructions and prescriptions given to pt and pt's daughter. Pt verbally acknowledged understanding.  Pt voiced no questions when prompted.  Pt to be transported out of unit per wheelchair by nurse tech. Daughter to transport pt to home by private car.  No distress noted.  Angeline Slim I 08/05/2014 1:33 PM

## 2015-10-10 DIAGNOSIS — D510 Vitamin B12 deficiency anemia due to intrinsic factor deficiency: Secondary | ICD-10-CM | POA: Diagnosis not present

## 2015-10-19 DIAGNOSIS — Z6839 Body mass index (BMI) 39.0-39.9, adult: Secondary | ICD-10-CM | POA: Diagnosis not present

## 2015-10-19 DIAGNOSIS — Z170 Estrogen receptor positive status [ER+]: Secondary | ICD-10-CM | POA: Diagnosis not present

## 2015-10-19 DIAGNOSIS — C50411 Malignant neoplasm of upper-outer quadrant of right female breast: Secondary | ICD-10-CM | POA: Diagnosis not present

## 2015-10-19 DIAGNOSIS — E669 Obesity, unspecified: Secondary | ICD-10-CM | POA: Diagnosis not present

## 2015-10-31 DIAGNOSIS — Z79810 Long term (current) use of selective estrogen receptor modulators (SERMs): Secondary | ICD-10-CM | POA: Diagnosis not present

## 2015-10-31 DIAGNOSIS — Z853 Personal history of malignant neoplasm of breast: Secondary | ICD-10-CM | POA: Diagnosis not present

## 2015-10-31 DIAGNOSIS — M8580 Other specified disorders of bone density and structure, unspecified site: Secondary | ICD-10-CM

## 2015-10-31 DIAGNOSIS — C50919 Malignant neoplasm of unspecified site of unspecified female breast: Secondary | ICD-10-CM | POA: Diagnosis not present

## 2015-10-31 DIAGNOSIS — E538 Deficiency of other specified B group vitamins: Secondary | ICD-10-CM

## 2015-10-31 DIAGNOSIS — N289 Disorder of kidney and ureter, unspecified: Secondary | ICD-10-CM

## 2015-10-31 DIAGNOSIS — D72829 Elevated white blood cell count, unspecified: Secondary | ICD-10-CM

## 2015-10-31 DIAGNOSIS — Z170 Estrogen receptor positive status [ER+]: Secondary | ICD-10-CM | POA: Diagnosis not present

## 2015-10-31 DIAGNOSIS — D649 Anemia, unspecified: Secondary | ICD-10-CM | POA: Diagnosis not present

## 2015-11-12 DIAGNOSIS — D519 Vitamin B12 deficiency anemia, unspecified: Secondary | ICD-10-CM | POA: Diagnosis not present

## 2015-12-03 DIAGNOSIS — N184 Chronic kidney disease, stage 4 (severe): Secondary | ICD-10-CM | POA: Diagnosis not present

## 2015-12-11 DIAGNOSIS — E569 Vitamin deficiency, unspecified: Secondary | ICD-10-CM | POA: Diagnosis not present

## 2015-12-11 DIAGNOSIS — C50411 Malignant neoplasm of upper-outer quadrant of right female breast: Secondary | ICD-10-CM | POA: Diagnosis not present

## 2015-12-11 DIAGNOSIS — I129 Hypertensive chronic kidney disease with stage 1 through stage 4 chronic kidney disease, or unspecified chronic kidney disease: Secondary | ICD-10-CM | POA: Diagnosis not present

## 2015-12-11 DIAGNOSIS — N184 Chronic kidney disease, stage 4 (severe): Secondary | ICD-10-CM | POA: Diagnosis not present

## 2015-12-11 DIAGNOSIS — M353 Polymyalgia rheumatica: Secondary | ICD-10-CM | POA: Diagnosis not present

## 2015-12-11 DIAGNOSIS — D649 Anemia, unspecified: Secondary | ICD-10-CM | POA: Diagnosis not present

## 2015-12-11 DIAGNOSIS — Z79810 Long term (current) use of selective estrogen receptor modulators (SERMs): Secondary | ICD-10-CM | POA: Diagnosis not present

## 2015-12-11 DIAGNOSIS — Z7952 Long term (current) use of systemic steroids: Secondary | ICD-10-CM | POA: Diagnosis not present

## 2015-12-13 DIAGNOSIS — D510 Vitamin B12 deficiency anemia due to intrinsic factor deficiency: Secondary | ICD-10-CM | POA: Diagnosis not present

## 2015-12-18 DIAGNOSIS — M5416 Radiculopathy, lumbar region: Secondary | ICD-10-CM | POA: Diagnosis not present

## 2015-12-18 DIAGNOSIS — M545 Low back pain: Secondary | ICD-10-CM | POA: Diagnosis not present

## 2015-12-18 DIAGNOSIS — Z6836 Body mass index (BMI) 36.0-36.9, adult: Secondary | ICD-10-CM | POA: Diagnosis not present

## 2015-12-18 DIAGNOSIS — M4806 Spinal stenosis, lumbar region: Secondary | ICD-10-CM | POA: Diagnosis not present

## 2016-01-15 DIAGNOSIS — D510 Vitamin B12 deficiency anemia due to intrinsic factor deficiency: Secondary | ICD-10-CM | POA: Diagnosis not present

## 2016-02-18 DIAGNOSIS — D510 Vitamin B12 deficiency anemia due to intrinsic factor deficiency: Secondary | ICD-10-CM | POA: Diagnosis not present

## 2016-03-10 DIAGNOSIS — N184 Chronic kidney disease, stage 4 (severe): Secondary | ICD-10-CM | POA: Diagnosis not present

## 2016-03-10 DIAGNOSIS — I10 Essential (primary) hypertension: Secondary | ICD-10-CM | POA: Diagnosis not present

## 2016-03-12 DIAGNOSIS — N179 Acute kidney failure, unspecified: Secondary | ICD-10-CM | POA: Diagnosis not present

## 2016-03-12 DIAGNOSIS — D631 Anemia in chronic kidney disease: Secondary | ICD-10-CM | POA: Diagnosis not present

## 2016-03-12 DIAGNOSIS — N184 Chronic kidney disease, stage 4 (severe): Secondary | ICD-10-CM | POA: Diagnosis not present

## 2016-03-12 DIAGNOSIS — I129 Hypertensive chronic kidney disease with stage 1 through stage 4 chronic kidney disease, or unspecified chronic kidney disease: Secondary | ICD-10-CM | POA: Diagnosis not present

## 2016-03-21 DIAGNOSIS — D510 Vitamin B12 deficiency anemia due to intrinsic factor deficiency: Secondary | ICD-10-CM | POA: Diagnosis not present

## 2016-04-01 DIAGNOSIS — R600 Localized edema: Secondary | ICD-10-CM | POA: Diagnosis not present

## 2016-04-09 DIAGNOSIS — I129 Hypertensive chronic kidney disease with stage 1 through stage 4 chronic kidney disease, or unspecified chronic kidney disease: Secondary | ICD-10-CM | POA: Diagnosis not present

## 2016-04-09 DIAGNOSIS — E782 Mixed hyperlipidemia: Secondary | ICD-10-CM | POA: Diagnosis not present

## 2016-04-09 DIAGNOSIS — M353 Polymyalgia rheumatica: Secondary | ICD-10-CM | POA: Diagnosis not present

## 2016-04-09 DIAGNOSIS — R7302 Impaired glucose tolerance (oral): Secondary | ICD-10-CM | POA: Diagnosis not present

## 2016-04-09 DIAGNOSIS — N184 Chronic kidney disease, stage 4 (severe): Secondary | ICD-10-CM | POA: Diagnosis not present

## 2016-04-22 DIAGNOSIS — N179 Acute kidney failure, unspecified: Secondary | ICD-10-CM | POA: Diagnosis not present

## 2016-04-22 DIAGNOSIS — D510 Vitamin B12 deficiency anemia due to intrinsic factor deficiency: Secondary | ICD-10-CM | POA: Diagnosis not present

## 2016-04-23 DIAGNOSIS — N179 Acute kidney failure, unspecified: Secondary | ICD-10-CM | POA: Diagnosis not present

## 2016-04-23 DIAGNOSIS — M353 Polymyalgia rheumatica: Secondary | ICD-10-CM | POA: Diagnosis not present

## 2016-04-23 DIAGNOSIS — N184 Chronic kidney disease, stage 4 (severe): Secondary | ICD-10-CM | POA: Diagnosis not present

## 2016-04-23 DIAGNOSIS — I129 Hypertensive chronic kidney disease with stage 1 through stage 4 chronic kidney disease, or unspecified chronic kidney disease: Secondary | ICD-10-CM | POA: Diagnosis not present

## 2016-05-05 DIAGNOSIS — Z79810 Long term (current) use of selective estrogen receptor modulators (SERMs): Secondary | ICD-10-CM | POA: Diagnosis not present

## 2016-05-05 DIAGNOSIS — Z853 Personal history of malignant neoplasm of breast: Secondary | ICD-10-CM | POA: Diagnosis not present

## 2016-05-08 DIAGNOSIS — B353 Tinea pedis: Secondary | ICD-10-CM | POA: Diagnosis not present

## 2016-05-21 DIAGNOSIS — Z780 Asymptomatic menopausal state: Secondary | ICD-10-CM | POA: Diagnosis not present

## 2016-05-21 DIAGNOSIS — M85851 Other specified disorders of bone density and structure, right thigh: Secondary | ICD-10-CM | POA: Diagnosis not present

## 2016-06-05 DIAGNOSIS — M7551 Bursitis of right shoulder: Secondary | ICD-10-CM | POA: Diagnosis not present

## 2016-06-12 DIAGNOSIS — H2703 Aphakia, bilateral: Secondary | ICD-10-CM | POA: Diagnosis not present

## 2016-06-24 DIAGNOSIS — M545 Low back pain: Secondary | ICD-10-CM | POA: Diagnosis not present

## 2016-06-24 DIAGNOSIS — Z6834 Body mass index (BMI) 34.0-34.9, adult: Secondary | ICD-10-CM | POA: Diagnosis not present

## 2016-06-24 DIAGNOSIS — I10 Essential (primary) hypertension: Secondary | ICD-10-CM | POA: Diagnosis not present

## 2016-08-20 DIAGNOSIS — N184 Chronic kidney disease, stage 4 (severe): Secondary | ICD-10-CM | POA: Diagnosis not present

## 2016-08-21 DIAGNOSIS — M545 Low back pain: Secondary | ICD-10-CM | POA: Diagnosis not present

## 2016-08-26 DIAGNOSIS — N183 Chronic kidney disease, stage 3 (moderate): Secondary | ICD-10-CM | POA: Diagnosis not present

## 2016-08-26 DIAGNOSIS — I129 Hypertensive chronic kidney disease with stage 1 through stage 4 chronic kidney disease, or unspecified chronic kidney disease: Secondary | ICD-10-CM | POA: Diagnosis not present

## 2016-08-26 DIAGNOSIS — M353 Polymyalgia rheumatica: Secondary | ICD-10-CM | POA: Diagnosis not present

## 2016-10-01 DIAGNOSIS — C50411 Malignant neoplasm of upper-outer quadrant of right female breast: Secondary | ICD-10-CM | POA: Diagnosis not present

## 2016-10-01 DIAGNOSIS — R928 Other abnormal and inconclusive findings on diagnostic imaging of breast: Secondary | ICD-10-CM | POA: Diagnosis not present

## 2016-10-10 DIAGNOSIS — E782 Mixed hyperlipidemia: Secondary | ICD-10-CM | POA: Diagnosis not present

## 2016-10-10 DIAGNOSIS — Z6837 Body mass index (BMI) 37.0-37.9, adult: Secondary | ICD-10-CM | POA: Diagnosis not present

## 2016-10-10 DIAGNOSIS — M353 Polymyalgia rheumatica: Secondary | ICD-10-CM | POA: Diagnosis not present

## 2016-10-10 DIAGNOSIS — I129 Hypertensive chronic kidney disease with stage 1 through stage 4 chronic kidney disease, or unspecified chronic kidney disease: Secondary | ICD-10-CM | POA: Diagnosis not present

## 2016-10-10 DIAGNOSIS — N183 Chronic kidney disease, stage 3 (moderate): Secondary | ICD-10-CM | POA: Diagnosis not present

## 2016-10-10 DIAGNOSIS — R011 Cardiac murmur, unspecified: Secondary | ICD-10-CM | POA: Diagnosis not present

## 2016-10-10 DIAGNOSIS — D510 Vitamin B12 deficiency anemia due to intrinsic factor deficiency: Secondary | ICD-10-CM | POA: Diagnosis not present

## 2016-10-10 DIAGNOSIS — R7302 Impaired glucose tolerance (oral): Secondary | ICD-10-CM | POA: Diagnosis not present

## 2016-10-13 DIAGNOSIS — C50411 Malignant neoplasm of upper-outer quadrant of right female breast: Secondary | ICD-10-CM | POA: Diagnosis not present

## 2016-10-13 DIAGNOSIS — Z170 Estrogen receptor positive status [ER+]: Secondary | ICD-10-CM | POA: Diagnosis not present

## 2016-10-13 DIAGNOSIS — Z6837 Body mass index (BMI) 37.0-37.9, adult: Secondary | ICD-10-CM | POA: Diagnosis not present

## 2016-11-19 DIAGNOSIS — Z79810 Long term (current) use of selective estrogen receptor modulators (SERMs): Secondary | ICD-10-CM | POA: Diagnosis not present

## 2016-11-19 DIAGNOSIS — C50919 Malignant neoplasm of unspecified site of unspecified female breast: Secondary | ICD-10-CM | POA: Diagnosis not present

## 2016-11-19 DIAGNOSIS — Z853 Personal history of malignant neoplasm of breast: Secondary | ICD-10-CM | POA: Diagnosis not present

## 2016-11-19 DIAGNOSIS — I10 Essential (primary) hypertension: Secondary | ICD-10-CM | POA: Diagnosis not present

## 2016-11-25 DIAGNOSIS — M79662 Pain in left lower leg: Secondary | ICD-10-CM | POA: Diagnosis not present

## 2016-11-25 DIAGNOSIS — M7989 Other specified soft tissue disorders: Secondary | ICD-10-CM | POA: Diagnosis not present

## 2016-11-25 DIAGNOSIS — I824Z2 Acute embolism and thrombosis of unspecified deep veins of left distal lower extremity: Secondary | ICD-10-CM | POA: Diagnosis not present

## 2016-12-11 DIAGNOSIS — R600 Localized edema: Secondary | ICD-10-CM | POA: Diagnosis not present

## 2016-12-11 DIAGNOSIS — N184 Chronic kidney disease, stage 4 (severe): Secondary | ICD-10-CM | POA: Diagnosis not present

## 2016-12-19 DIAGNOSIS — I129 Hypertensive chronic kidney disease with stage 1 through stage 4 chronic kidney disease, or unspecified chronic kidney disease: Secondary | ICD-10-CM | POA: Diagnosis not present

## 2016-12-19 DIAGNOSIS — N183 Chronic kidney disease, stage 3 (moderate): Secondary | ICD-10-CM | POA: Diagnosis not present

## 2016-12-19 DIAGNOSIS — M353 Polymyalgia rheumatica: Secondary | ICD-10-CM | POA: Diagnosis not present

## 2016-12-19 DIAGNOSIS — R600 Localized edema: Secondary | ICD-10-CM | POA: Diagnosis not present

## 2017-02-17 DIAGNOSIS — R600 Localized edema: Secondary | ICD-10-CM | POA: Diagnosis not present

## 2017-02-23 DIAGNOSIS — N184 Chronic kidney disease, stage 4 (severe): Secondary | ICD-10-CM | POA: Diagnosis not present

## 2017-03-19 DIAGNOSIS — N183 Chronic kidney disease, stage 3 (moderate): Secondary | ICD-10-CM | POA: Diagnosis not present

## 2017-03-20 DIAGNOSIS — H3530 Unspecified macular degeneration: Secondary | ICD-10-CM | POA: Diagnosis not present

## 2017-03-20 DIAGNOSIS — H16223 Keratoconjunctivitis sicca, not specified as Sjogren's, bilateral: Secondary | ICD-10-CM | POA: Diagnosis not present

## 2017-03-24 DIAGNOSIS — I129 Hypertensive chronic kidney disease with stage 1 through stage 4 chronic kidney disease, or unspecified chronic kidney disease: Secondary | ICD-10-CM | POA: Diagnosis not present

## 2017-03-24 DIAGNOSIS — R600 Localized edema: Secondary | ICD-10-CM | POA: Diagnosis not present

## 2017-03-24 DIAGNOSIS — N184 Chronic kidney disease, stage 4 (severe): Secondary | ICD-10-CM | POA: Diagnosis not present

## 2017-04-10 DIAGNOSIS — I10 Essential (primary) hypertension: Secondary | ICD-10-CM | POA: Diagnosis not present

## 2017-04-10 DIAGNOSIS — R7302 Impaired glucose tolerance (oral): Secondary | ICD-10-CM | POA: Diagnosis not present

## 2017-04-10 DIAGNOSIS — R739 Hyperglycemia, unspecified: Secondary | ICD-10-CM | POA: Diagnosis not present

## 2017-04-10 DIAGNOSIS — E782 Mixed hyperlipidemia: Secondary | ICD-10-CM | POA: Diagnosis not present

## 2017-04-16 DIAGNOSIS — R7302 Impaired glucose tolerance (oral): Secondary | ICD-10-CM | POA: Diagnosis not present

## 2017-04-16 DIAGNOSIS — M353 Polymyalgia rheumatica: Secondary | ICD-10-CM | POA: Diagnosis not present

## 2017-04-16 DIAGNOSIS — I10 Essential (primary) hypertension: Secondary | ICD-10-CM | POA: Diagnosis not present

## 2017-04-16 DIAGNOSIS — E782 Mixed hyperlipidemia: Secondary | ICD-10-CM | POA: Diagnosis not present

## 2017-05-05 DIAGNOSIS — Z79810 Long term (current) use of selective estrogen receptor modulators (SERMs): Secondary | ICD-10-CM | POA: Diagnosis not present

## 2017-05-05 DIAGNOSIS — Z853 Personal history of malignant neoplasm of breast: Secondary | ICD-10-CM | POA: Diagnosis not present

## 2017-05-27 DIAGNOSIS — R600 Localized edema: Secondary | ICD-10-CM | POA: Diagnosis not present

## 2017-05-27 DIAGNOSIS — N184 Chronic kidney disease, stage 4 (severe): Secondary | ICD-10-CM | POA: Diagnosis not present

## 2017-05-27 DIAGNOSIS — I129 Hypertensive chronic kidney disease with stage 1 through stage 4 chronic kidney disease, or unspecified chronic kidney disease: Secondary | ICD-10-CM | POA: Diagnosis not present

## 2017-06-21 DIAGNOSIS — L03116 Cellulitis of left lower limb: Secondary | ICD-10-CM | POA: Diagnosis not present

## 2017-06-21 DIAGNOSIS — I129 Hypertensive chronic kidney disease with stage 1 through stage 4 chronic kidney disease, or unspecified chronic kidney disease: Secondary | ICD-10-CM | POA: Diagnosis not present

## 2017-06-21 DIAGNOSIS — A419 Sepsis, unspecified organism: Secondary | ICD-10-CM | POA: Diagnosis not present

## 2017-06-21 DIAGNOSIS — N189 Chronic kidney disease, unspecified: Secondary | ICD-10-CM | POA: Diagnosis not present

## 2017-06-22 DIAGNOSIS — A419 Sepsis, unspecified organism: Secondary | ICD-10-CM | POA: Diagnosis not present

## 2017-06-25 DIAGNOSIS — L0390 Cellulitis, unspecified: Secondary | ICD-10-CM | POA: Diagnosis not present

## 2017-08-12 DIAGNOSIS — N184 Chronic kidney disease, stage 4 (severe): Secondary | ICD-10-CM | POA: Diagnosis not present

## 2017-08-19 DIAGNOSIS — R600 Localized edema: Secondary | ICD-10-CM | POA: Diagnosis not present

## 2017-08-19 DIAGNOSIS — N184 Chronic kidney disease, stage 4 (severe): Secondary | ICD-10-CM | POA: Diagnosis not present

## 2017-08-19 DIAGNOSIS — I129 Hypertensive chronic kidney disease with stage 1 through stage 4 chronic kidney disease, or unspecified chronic kidney disease: Secondary | ICD-10-CM | POA: Diagnosis not present

## 2017-09-18 DIAGNOSIS — E782 Mixed hyperlipidemia: Secondary | ICD-10-CM | POA: Diagnosis not present

## 2017-09-18 DIAGNOSIS — R7302 Impaired glucose tolerance (oral): Secondary | ICD-10-CM | POA: Diagnosis not present

## 2017-09-18 DIAGNOSIS — Z0000 Encounter for general adult medical examination without abnormal findings: Secondary | ICD-10-CM | POA: Diagnosis not present

## 2017-09-18 DIAGNOSIS — R7303 Prediabetes: Secondary | ICD-10-CM | POA: Diagnosis not present

## 2017-09-18 DIAGNOSIS — I10 Essential (primary) hypertension: Secondary | ICD-10-CM | POA: Diagnosis not present

## 2017-09-18 DIAGNOSIS — Z23 Encounter for immunization: Secondary | ICD-10-CM | POA: Diagnosis not present

## 2017-09-18 DIAGNOSIS — M353 Polymyalgia rheumatica: Secondary | ICD-10-CM | POA: Diagnosis not present

## 2017-10-05 DIAGNOSIS — C50411 Malignant neoplasm of upper-outer quadrant of right female breast: Secondary | ICD-10-CM | POA: Diagnosis not present

## 2017-10-05 DIAGNOSIS — Z9889 Other specified postprocedural states: Secondary | ICD-10-CM | POA: Diagnosis not present

## 2017-10-05 DIAGNOSIS — R928 Other abnormal and inconclusive findings on diagnostic imaging of breast: Secondary | ICD-10-CM | POA: Diagnosis not present

## 2018-05-05 DIAGNOSIS — C50919 Malignant neoplasm of unspecified site of unspecified female breast: Secondary | ICD-10-CM | POA: Diagnosis not present

## 2018-05-05 DIAGNOSIS — Z79810 Long term (current) use of selective estrogen receptor modulators (SERMs): Secondary | ICD-10-CM | POA: Diagnosis not present

## 2019-06-14 ENCOUNTER — Emergency Department (HOSPITAL_COMMUNITY): Payer: Medicare HMO

## 2019-06-14 ENCOUNTER — Emergency Department (HOSPITAL_COMMUNITY)
Admission: EM | Admit: 2019-06-14 | Discharge: 2019-07-07 | Disposition: E | Payer: Medicare HMO | Attending: Emergency Medicine | Admitting: Emergency Medicine

## 2019-06-14 ENCOUNTER — Encounter (HOSPITAL_COMMUNITY): Payer: Self-pay | Admitting: Pharmacy Technician

## 2019-06-14 DIAGNOSIS — I9589 Other hypotension: Secondary | ICD-10-CM | POA: Diagnosis not present

## 2019-06-14 DIAGNOSIS — E869 Volume depletion, unspecified: Secondary | ICD-10-CM | POA: Insufficient documentation

## 2019-06-14 DIAGNOSIS — S0292XB Unspecified fracture of facial bones, initial encounter for open fracture: Secondary | ICD-10-CM | POA: Diagnosis not present

## 2019-06-14 DIAGNOSIS — S0291XB Unspecified fracture of skull, initial encounter for open fracture: Secondary | ICD-10-CM | POA: Insufficient documentation

## 2019-06-14 DIAGNOSIS — I468 Cardiac arrest due to other underlying condition: Secondary | ICD-10-CM | POA: Diagnosis not present

## 2019-06-14 DIAGNOSIS — S27399A Other injuries of lung, unspecified, initial encounter: Secondary | ICD-10-CM | POA: Diagnosis not present

## 2019-06-14 DIAGNOSIS — S270XXA Traumatic pneumothorax, initial encounter: Secondary | ICD-10-CM | POA: Insufficient documentation

## 2019-06-14 DIAGNOSIS — Y929 Unspecified place or not applicable: Secondary | ICD-10-CM | POA: Diagnosis not present

## 2019-06-14 DIAGNOSIS — Y939 Activity, unspecified: Secondary | ICD-10-CM | POA: Insufficient documentation

## 2019-06-14 DIAGNOSIS — E861 Hypovolemia: Secondary | ICD-10-CM

## 2019-06-14 DIAGNOSIS — T07XXXA Unspecified multiple injuries, initial encounter: Secondary | ICD-10-CM | POA: Diagnosis present

## 2019-06-14 DIAGNOSIS — Y999 Unspecified external cause status: Secondary | ICD-10-CM | POA: Diagnosis not present

## 2019-06-14 DIAGNOSIS — S27329A Contusion of lung, unspecified, initial encounter: Secondary | ICD-10-CM

## 2019-06-14 LAB — CBC
HCT: 28.7 % — ABNORMAL LOW (ref 36.0–46.0)
Hemoglobin: 8.7 g/dL — ABNORMAL LOW (ref 12.0–15.0)
MCH: 32.3 pg (ref 26.0–34.0)
MCHC: 30.3 g/dL (ref 30.0–36.0)
MCV: 106.7 fL — ABNORMAL HIGH (ref 80.0–100.0)
Platelets: 177 10*3/uL (ref 150–400)
RBC: 2.69 MIL/uL — ABNORMAL LOW (ref 3.87–5.11)
RDW: 14.4 % (ref 11.5–15.5)
WBC: 21.4 10*3/uL — ABNORMAL HIGH (ref 4.0–10.5)
nRBC: 0.1 % (ref 0.0–0.2)

## 2019-06-14 LAB — COMPREHENSIVE METABOLIC PANEL
ALT: 23 U/L (ref 0–44)
AST: 32 U/L (ref 15–41)
Albumin: 2.9 g/dL — ABNORMAL LOW (ref 3.5–5.0)
Alkaline Phosphatase: 47 U/L (ref 38–126)
Anion gap: 8 (ref 5–15)
BUN: 25 mg/dL — ABNORMAL HIGH (ref 8–23)
CO2: 24 mmol/L (ref 22–32)
Calcium: 7.9 mg/dL — ABNORMAL LOW (ref 8.9–10.3)
Chloride: 105 mmol/L (ref 98–111)
Creatinine, Ser: 1.86 mg/dL — ABNORMAL HIGH (ref 0.44–1.00)
GFR calc Af Amer: 28 mL/min — ABNORMAL LOW (ref >60)
GFR calc non Af Amer: 24 mL/min — ABNORMAL LOW (ref >60)
Glucose, Bld: 193 mg/dL — ABNORMAL HIGH (ref 70–99)
Potassium: 5.8 mmol/L — ABNORMAL HIGH (ref 3.5–5.1)
Sodium: 137 mmol/L (ref 135–145)
Total Bilirubin: 0.4 mg/dL (ref 0.3–1.2)
Total Protein: 5.3 g/dL — ABNORMAL LOW (ref 6.5–8.1)

## 2019-06-14 LAB — POCT I-STAT 7, (LYTES, BLD GAS, ICA,H+H)
Acid-base deficit: 5 mmol/L — ABNORMAL HIGH (ref 0.0–2.0)
Bicarbonate: 21.5 mmol/L (ref 20.0–28.0)
Calcium, Ion: 1 mmol/L — ABNORMAL LOW (ref 1.15–1.40)
HCT: 30 % — ABNORMAL LOW (ref 36.0–46.0)
Hemoglobin: 10.2 g/dL — ABNORMAL LOW (ref 12.0–15.0)
O2 Saturation: 93 %
Patient temperature: 93.4
Potassium: 6.5 mmol/L (ref 3.5–5.1)
Sodium: 138 mmol/L (ref 135–145)
TCO2: 23 mmol/L (ref 22–32)
pCO2 arterial: 41.4 mmHg (ref 32.0–48.0)
pH, Arterial: 7.309 — ABNORMAL LOW (ref 7.350–7.450)
pO2, Arterial: 63 mmHg — ABNORMAL LOW (ref 83.0–108.0)

## 2019-06-14 LAB — LACTIC ACID, PLASMA: Lactic Acid, Venous: 3.5 mmol/L (ref 0.5–1.9)

## 2019-06-14 LAB — PROTIME-INR
INR: 1.1 (ref 0.8–1.2)
Prothrombin Time: 14.3 seconds (ref 11.4–15.2)

## 2019-06-14 LAB — ABO/RH: ABO/RH(D): A NEG

## 2019-06-14 LAB — I-STAT CHEM 8, ED
BUN: 29 mg/dL — ABNORMAL HIGH (ref 8–23)
Calcium, Ion: 1.06 mmol/L — ABNORMAL LOW (ref 1.15–1.40)
Chloride: 109 mmol/L (ref 98–111)
Creatinine, Ser: 1.7 mg/dL — ABNORMAL HIGH (ref 0.44–1.00)
Glucose, Bld: 184 mg/dL — ABNORMAL HIGH (ref 70–99)
HCT: 27 % — ABNORMAL LOW (ref 36.0–46.0)
Hemoglobin: 9.2 g/dL — ABNORMAL LOW (ref 12.0–15.0)
Potassium: 5.8 mmol/L — ABNORMAL HIGH (ref 3.5–5.1)
Sodium: 140 mmol/L (ref 135–145)
TCO2: 25 mmol/L (ref 22–32)

## 2019-06-14 LAB — TRIGLYCERIDES: Triglycerides: 121 mg/dL (ref <150)

## 2019-06-14 LAB — ETHANOL: Alcohol, Ethyl (B): <10 mg/dL (ref <10)

## 2019-06-14 MED ORDER — FENTANYL CITRATE (PF) 100 MCG/2ML IJ SOLN: 50.0000 ug | INTRAMUSCULAR | Status: DC | PRN

## 2019-06-14 MED ORDER — FENTANYL CITRATE (PF) 100 MCG/2ML IJ SOLN
INTRAMUSCULAR | Status: AC
  Filled 2019-06-14: qty 2

## 2019-06-14 MED ORDER — CEFAZOLIN SODIUM-DEXTROSE 1-4 GM/50ML-% IV SOLN: 1.0000 g | Freq: Once | INTRAVENOUS | Status: DC

## 2019-06-14 MED ORDER — CALCIUM CHLORIDE 10 % IV SOLN
INTRAVENOUS | Status: AC | PRN
  Administered 2019-06-14: 1 g via INTRAVENOUS

## 2019-06-14 MED ORDER — MIDAZOLAM HCL 5 MG/5ML IJ SOLN
INTRAMUSCULAR | Status: AC | PRN
  Administered 2019-06-14: 2 mg via INTRAVENOUS

## 2019-06-14 MED ORDER — FENTANYL CITRATE (PF) 100 MCG/2ML IJ SOLN
INTRAMUSCULAR | Status: AC | PRN
  Administered 2019-06-14: 100 ug via INTRAVENOUS

## 2019-06-14 MED ORDER — PROPOFOL 1000 MG/100ML IV EMUL
INTRAVENOUS | Status: AC
  Filled 2019-06-14: qty 100

## 2019-06-14 MED ORDER — EPINEPHRINE 1 MG/10ML IJ SOSY
PREFILLED_SYRINGE | INTRAMUSCULAR | Status: AC | PRN
  Administered 2019-06-14: 1 via INTRAVENOUS

## 2019-06-14 MED ORDER — PROPOFOL 1000 MG/100ML IV EMUL
0.0000 ug/kg/min | INTRAVENOUS | Status: DC
  Administered 2019-06-14: 10 ug/kg/min via INTRAVENOUS

## 2019-06-14 MED ORDER — IOHEXOL 300 MG/ML  SOLN
100.0000 mL | Freq: Once | INTRAMUSCULAR | Status: AC | PRN
  Administered 2019-06-14: 19:00:00 100 mL via INTRAVENOUS

## 2019-06-14 MED ORDER — SUCCINYLCHOLINE CHLORIDE 20 MG/ML IJ SOLN
INTRAMUSCULAR | Status: AC | PRN
  Administered 2019-06-14: 100 mg via INTRAVENOUS

## 2019-06-14 MED ORDER — MIDAZOLAM HCL 2 MG/2ML IJ SOLN
INTRAMUSCULAR | Status: AC
  Filled 2019-06-14: qty 2

## 2019-06-14 MED ORDER — ETOMIDATE 2 MG/ML IV SOLN
INTRAVENOUS | Status: AC | PRN
  Administered 2019-06-14: 20 mg via INTRAVENOUS

## 2019-06-15 ENCOUNTER — Encounter (HOSPITAL_COMMUNITY): Payer: Self-pay | Admitting: Emergency Medicine

## 2019-06-15 LAB — BPAM FFP
Blood Product Expiration Date: 202009132359
Blood Product Expiration Date: 202009132359
Blood Product Expiration Date: 202009132359
Blood Product Expiration Date: 202009132359
Blood Product Expiration Date: 202009132359
Blood Product Expiration Date: 202009132359
Blood Product Expiration Date: 202009132359
Blood Product Expiration Date: 202009132359
Blood Product Expiration Date: 202009132359
Blood Product Expiration Date: 202009132359
Blood Product Expiration Date: 202009132359
Blood Product Expiration Date: 202009132359
Blood Product Expiration Date: 202009132359
Blood Product Expiration Date: 202009132359
Blood Product Expiration Date: 202009232359
Blood Product Expiration Date: 202009252359
Blood Product Expiration Date: 202009252359
ISSUE DATE / TIME: 202009081833
ISSUE DATE / TIME: 202009081833
ISSUE DATE / TIME: 202009081901
ISSUE DATE / TIME: 202009081901
ISSUE DATE / TIME: 202009081911
ISSUE DATE / TIME: 202009081911
ISSUE DATE / TIME: 202009081911
ISSUE DATE / TIME: 202009081932
ISSUE DATE / TIME: 202009081932
ISSUE DATE / TIME: 202009081958
ISSUE DATE / TIME: 202009081958
ISSUE DATE / TIME: 202009082013
ISSUE DATE / TIME: 202009082013
Unit Type and Rh: 600
Unit Type and Rh: 600
Unit Type and Rh: 600
Unit Type and Rh: 6200
Unit Type and Rh: 6200
Unit Type and Rh: 6200
Unit Type and Rh: 6200
Unit Type and Rh: 6200
Unit Type and Rh: 6200
Unit Type and Rh: 6200
Unit Type and Rh: 6200
Unit Type and Rh: 6200
Unit Type and Rh: 6200
Unit Type and Rh: 6200
Unit Type and Rh: 6200
Unit Type and Rh: 6200
Unit Type and Rh: 6200

## 2019-06-15 LAB — TYPE AND SCREEN
ABO/RH(D): A NEG
Antibody Screen: NEGATIVE
Unit division: 0
Unit division: 0
Unit division: 0
Unit division: 0
Unit division: 0
Unit division: 0
Unit division: 0
Unit division: 0
Unit division: 0
Unit division: 0
Unit division: 0
Unit division: 0
Unit division: 0
Unit division: 0
Unit division: 0
Unit division: 0

## 2019-06-15 LAB — BPAM RBC
Blood Product Expiration Date: 202010012359
Blood Product Expiration Date: 202010012359
Blood Product Expiration Date: 202010012359
Blood Product Expiration Date: 202010022359
Blood Product Expiration Date: 202010022359
Blood Product Expiration Date: 202010022359
Blood Product Expiration Date: 202010022359
Blood Product Expiration Date: 202010042359
Blood Product Expiration Date: 202010072359
Blood Product Expiration Date: 202010082359
Blood Product Expiration Date: 202010122359
Blood Product Expiration Date: 202010132359
Blood Product Expiration Date: 202010132359
Blood Product Expiration Date: 202010132359
Blood Product Expiration Date: 202010132359
Blood Product Expiration Date: 202010132359
ISSUE DATE / TIME: 202009081833
ISSUE DATE / TIME: 202009081833
ISSUE DATE / TIME: 202009081853
ISSUE DATE / TIME: 202009081853
ISSUE DATE / TIME: 202009081901
ISSUE DATE / TIME: 202009081902
ISSUE DATE / TIME: 202009081911
ISSUE DATE / TIME: 202009081911
ISSUE DATE / TIME: 202009081922
ISSUE DATE / TIME: 202009081922
ISSUE DATE / TIME: 202009081946
ISSUE DATE / TIME: 202009081946
ISSUE DATE / TIME: 202009082000
ISSUE DATE / TIME: 202009082000
ISSUE DATE / TIME: 202009082009
ISSUE DATE / TIME: 202009082009
Unit Type and Rh: 5100
Unit Type and Rh: 5100
Unit Type and Rh: 5100
Unit Type and Rh: 5100
Unit Type and Rh: 5100
Unit Type and Rh: 5100
Unit Type and Rh: 600
Unit Type and Rh: 600
Unit Type and Rh: 600
Unit Type and Rh: 600
Unit Type and Rh: 600
Unit Type and Rh: 600
Unit Type and Rh: 6200
Unit Type and Rh: 6200
Unit Type and Rh: 6200
Unit Type and Rh: 6200

## 2019-06-15 LAB — PREPARE FRESH FROZEN PLASMA
Unit division: 0
Unit division: 0
Unit division: 0
Unit division: 0
Unit division: 0
Unit division: 0
Unit division: 0
Unit division: 0
Unit division: 0
Unit division: 0
Unit division: 0
Unit division: 0
Unit division: 0

## 2019-06-15 LAB — BPAM CRYOPRECIPITATE
Blood Product Expiration Date: 202009090205
ISSUE DATE / TIME: 202009082037
Unit Type and Rh: 6200

## 2019-06-15 LAB — PREPARE PLATELET PHERESIS: Unit division: 0

## 2019-06-15 LAB — BPAM PLATELET PHERESIS
Blood Product Expiration Date: 202009102359
ISSUE DATE / TIME: 202009081922
Unit Type and Rh: 6200

## 2019-06-15 LAB — PREPARE CRYOPRECIPITATE: Unit division: 0

## 2019-06-15 MED FILL — Medication: Qty: 1 | Status: AC

## 2019-06-16 LAB — BLOOD PRODUCT ORDER (VERBAL) VERIFICATION

## 2019-06-20 ENCOUNTER — Encounter (HOSPITAL_COMMUNITY): Payer: Self-pay | Admitting: Neurosurgery
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
## 2019-07-07 NOTE — ED Notes (Signed)
No manual per MD

## 2019-07-07 NOTE — ED Notes (Signed)
Pulse check PEA, CPR resumed.

## 2019-07-07 NOTE — ED Notes (Signed)
Triple lumen CVC placed emergently by MD Donne Hazel on pt arrival noted to be arterial after hooking fluids up to gravity. MD Donne Hazel made aware, and plan for new central line to be placed.

## 2019-07-07 NOTE — ED Notes (Signed)
CPR resumed 

## 2019-07-07 NOTE — ED Notes (Signed)
CPR initiated, no pulse

## 2019-07-07 NOTE — ED Notes (Signed)
+   bil breath sounds

## 2019-07-07 NOTE — ED Notes (Signed)
PULSE CHECK FINE VFIB, SHOCK DELIVERED
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
## 2019-07-07 NOTE — ED Notes (Signed)
Pulse check with ROSC.  
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
## 2019-07-07 NOTE — ED Notes (Signed)
7.5ETT 24@ the lip, + color change

## 2019-07-07 NOTE — Progress Notes (Signed)
Met with family of Danielle Mata in the consult room as they were awaiting to hear from a doctor.  Family did not appear to need me to stay with them after I introduced myself.  I asked if they needed anything and they replied no.  Let them know to have me paged if needed.

## 2019-07-07 NOTE — ED Notes (Signed)
vfib still on monitor, another shock given
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
## 2019-07-07 NOTE — Progress Notes (Signed)
Patient ID: Danielle Mata, female   DOB: 1927/12/15, 84 y.o.   MRN: [id number] Preoperative diagnosis: need for central access Postoperative diagnosis: same as above Procedure: left femoral central line placement Complications arterial insertion  Indications: [name] who arrived with no blood pressure. She needed iv access.  Procedure: I prepped the left groin quickly. I really was unable to palpate a pulse. I placed the needle and inserted into the artery first. I then accessed what I believed to be the vein. The wire was passed.  I then dilated the tract and inserted the large bore triple lumen catheter.  I secured this. This still appeared to be venous. It wasn't until her bp returned that it became clear that I had placed this in the artery. We immediately stopped using it at that time. I did leave in place.

## 2019-07-07 NOTE — ED Notes (Signed)
TOD called

## 2019-07-07 NOTE — ED Notes (Signed)
L chest tube place

## 2019-07-07 NOTE — ED Notes (Signed)
FAST with +pulse. Faint central.

## 2019-07-07 NOTE — ED Notes (Signed)
cverbal order hold epi, give more blood and fluid.

## 2019-07-07 NOTE — ED Notes (Signed)
Pt with multiple wounds. Lac to toe on L foot, deformity to LLE, skin tears to R forearm, the L  Upper arm, the L hand and the L elbow, deformity to L wrist, scattered abrasions to face, ecchymosis to Bil eyes R>L, open depressed skull fx to R.

## 2019-07-07 NOTE — ED Notes (Signed)
R chest tube being placed at this time

## 2019-07-07 NOTE — ED Notes (Signed)
CPR initiated
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
## 2019-07-07 NOTE — ED Notes (Signed)
Lab called with lactic acid 3.5 and CT tech letting ED RN know.

## 2019-07-07 DEATH — deceased
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
## 2020-06-28 IMAGING — CT CT MAXILLOFACIAL W/O CM
3 series · 12 of 47 positions shown, 14 images · non-contrast
Comparison: Head CT 05/16/2014

CLINICAL DATA: Head trauma, moderate/severe, GCS less than 13,
initial exam. Poly trauma, critical.

EXAM:
CT HEAD WITHOUT CONTRAST
CT MAXILLOFACIAL WITHOUT CONTRAST
CT CERVICAL SPINE WITHOUT CONTRAST
TECHNIQUE: Contiguous axial images were obtained from the base of the skull
through the vertex without intravenous contrast. Multidetector CT
imaging of the maxillofacial structures was performed. Multiplanar
CT image reconstructions were also generated. Multidetector CT
imaging of the cervical spine was performed without intravenous
contrast. Multiplanar CT image reconstructions were also generated.

[Series 3: facialbone 2.0 st · axial · 0.40mm/px · z∈[+1310,+1448]mm · 6 of 89 slices shown, 8 images]
[im 10/89  brain]
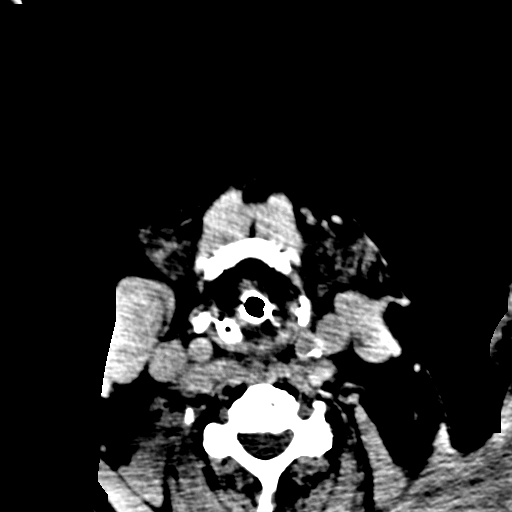
[im 10/89  bone]
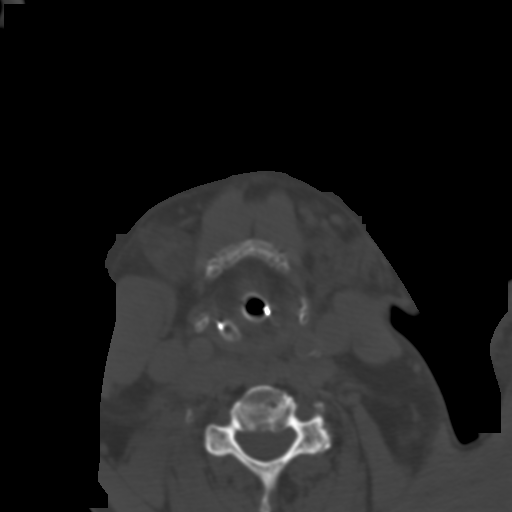
[im 25/89  bone]
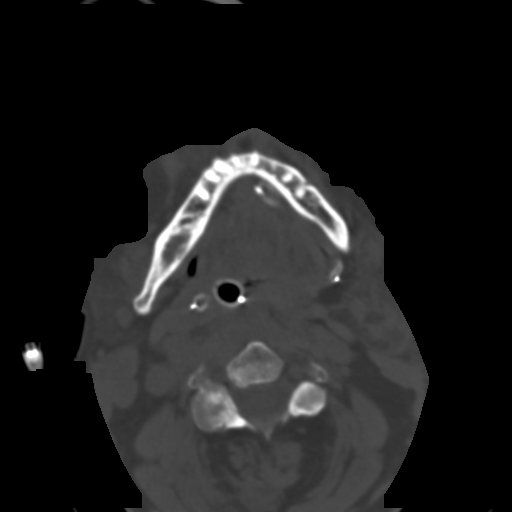
[im 37/89  bone]
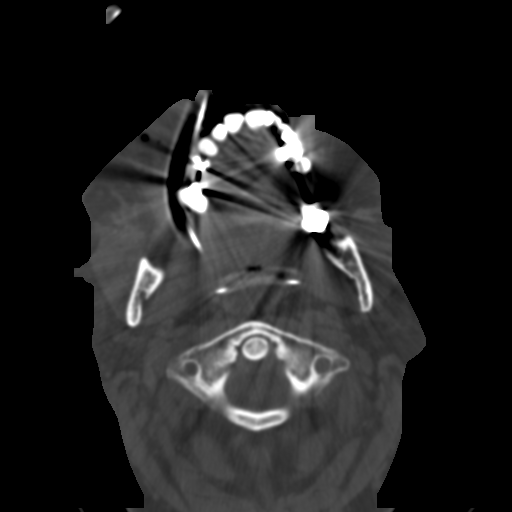
[im 52/89  bone]
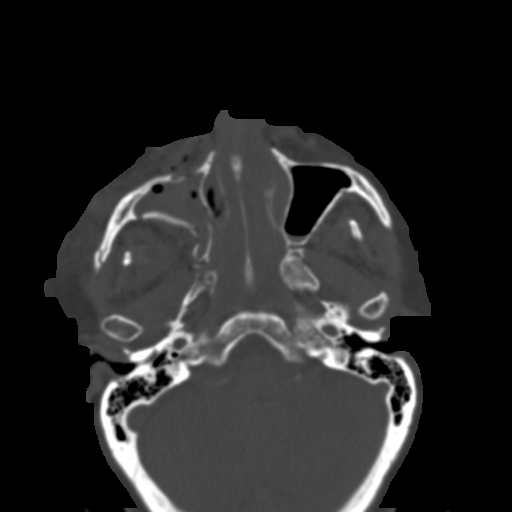
[im 67/89  brain]
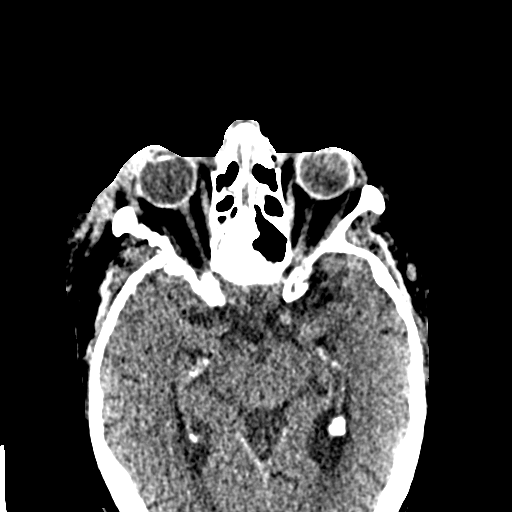
[im 67/89  bone]
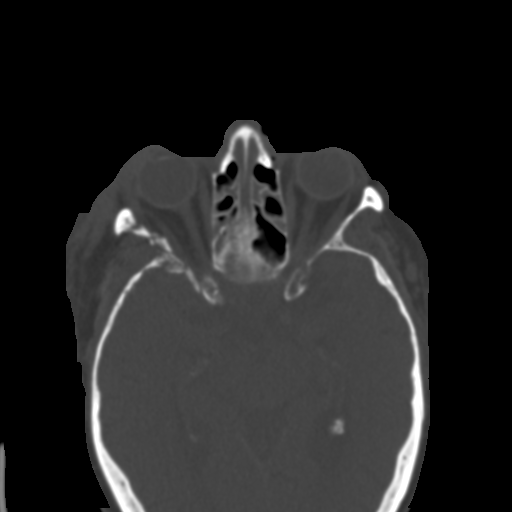
[im 79/89  bone]
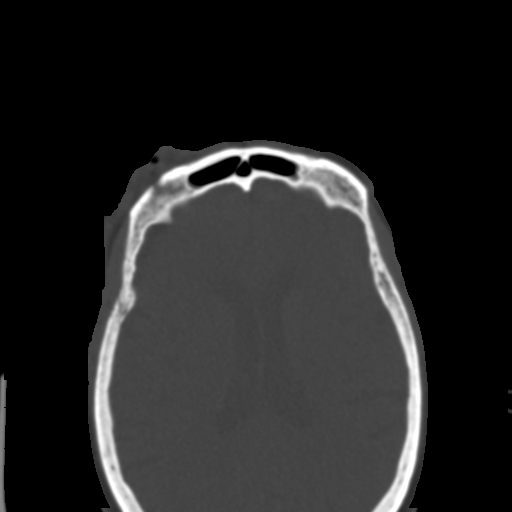

[Series 9: facialbone 2.0 cor st · coronal · 0.34mm/px · 3 of 96 slices shown]
[im 32/96  bone]
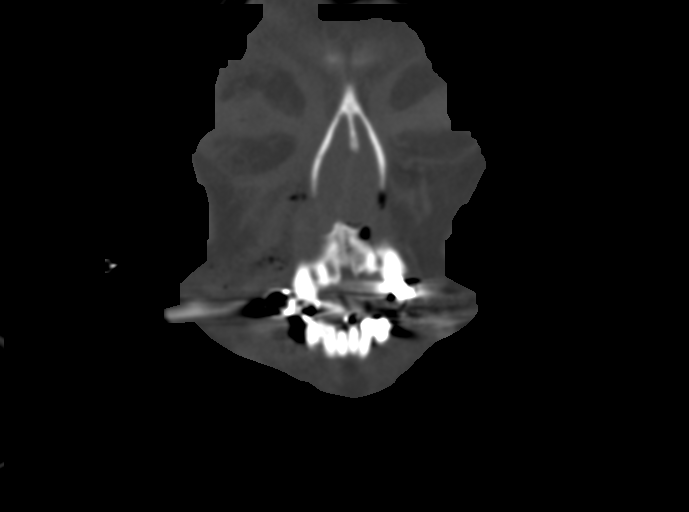
[im 43/96  bone]
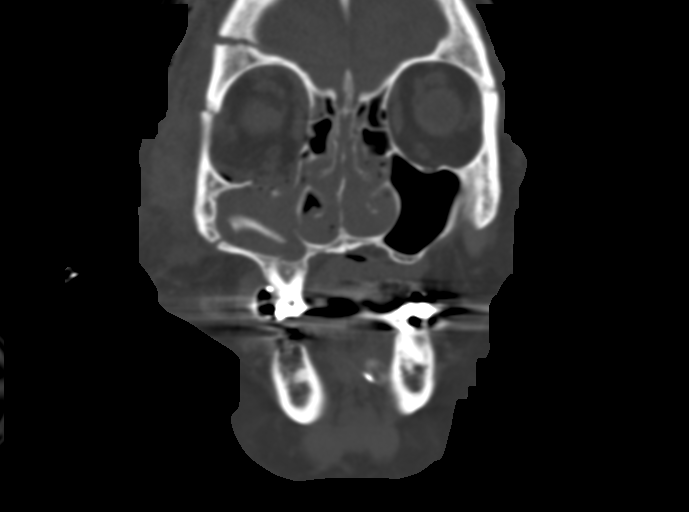
[im 53/96  bone]
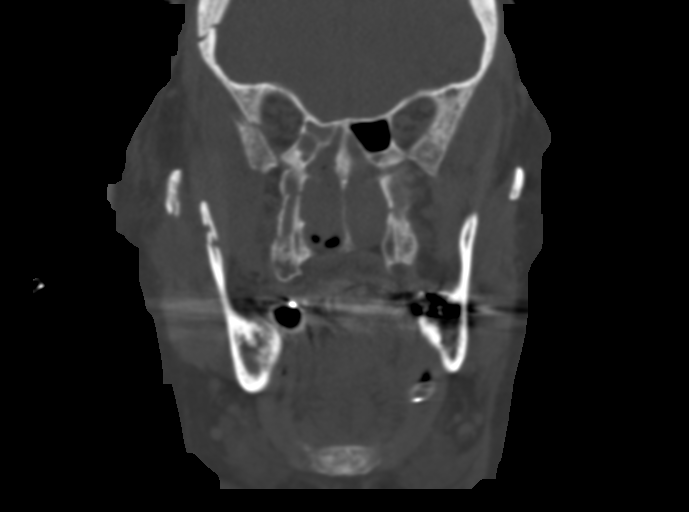

[Series 10: facialbone 2.0 sag st · sagittal · 0.34mm/px · 3 of 102 slices shown]
[im 34/102  bone]
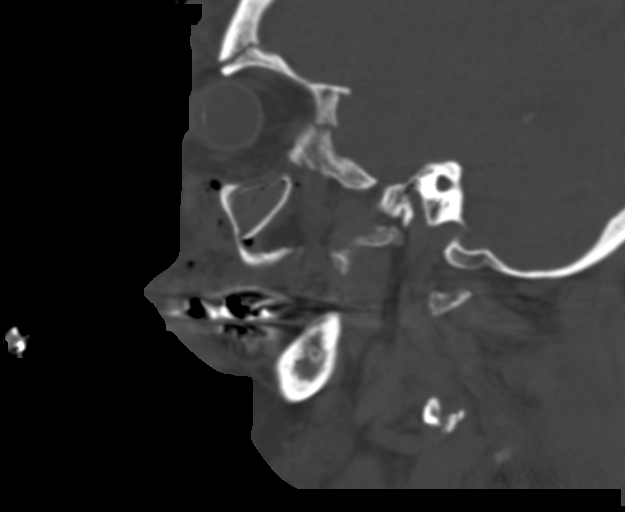
[im 51/102  bone]
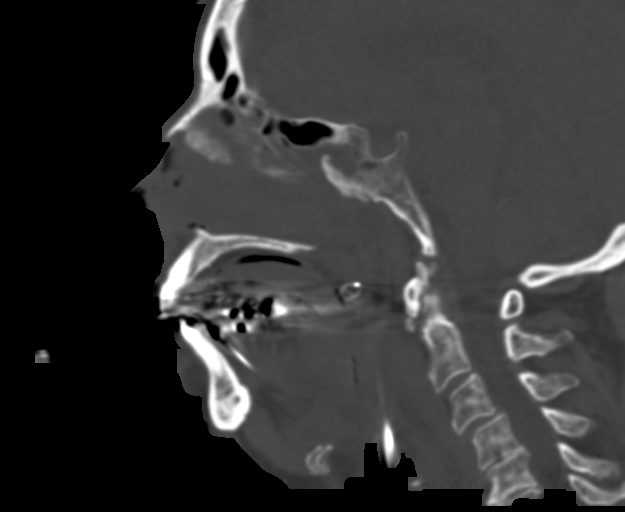
[im 68/102  bone]
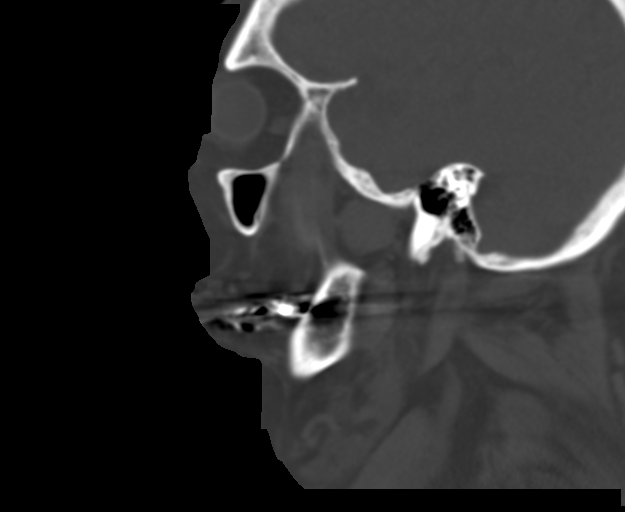

[12 of 47 positions shown; findings below may reference images not displayed]

FINDINGS: CT HEAD FINDINGS

Brain: There are several small foci of pneumocephalus as well as
trace extra-axial hemorrhage adjacent to the site of a nondepressed
right frontal calvarial fracture. No other acute intracranial
hemorrhage is identified. No demarcated cortical infarction. Mild
generalized parenchymal atrophy. Ill-defined hypoattenuation of the
cerebral white matter is nonspecific, but consistent with chronic
small vessel ischemic disease. Partially empty sella turcica.

Vascular: No hyperdense vessel. Atherosclerotic calcification of the
carotid artery siphons.

Skull: There is a nondepressed, displaced fracture of the right
frontal calvarium with fracture lucency extending through the right
orbital roof and also likely extending into the right frontal sinus.
Comminuted fracture of the squamosal right temporal bone with
associated comminuted fracturing of the right sphenoid bone.
Fracture lucencies extend to the right orbital apex. No definite
involvement of the right carotid canal

Other: Large right frontal scalp to right periorbital/maxillofacial
hematoma and laceration with associated subcutaneous gas. No
significant mastoid effusion.

CT MAXILLOFACIAL FINDINGS

Osseous: There are acute complex maxillofacial fractures as follows.
Comminuted and displaced fracturing of the anterior, medial and
posterior walls of the right maxillary sinus. A fracture extends
from the anterior wall of the right maxillary sinus into the right
nasal passage. Comminuted, depressed fracturing of the right orbital
floor. Comminuted and displaced fracturing of the right lateral
orbital wall. Multiple fracture fragments are displaced into the
right maxillary sinus. Multiple fractures also traverse the right
lamina papyracea. Multiple nondisplaced fractures also traversed the
cribriform plate bilateral. Displaced fractures of the pterygoid
plates on the right. Comminuted fracture of the coronoid process of
the right mandible. Mildly displaced AP oriented fracture involving
the left maxillary alveolar ridge and extending into the left hard
palate.

Orbits: No evidence of retrobulbar hematoma.

Sinuses: Near complete opacification of the right maxillary sinus.
Partial opacification of bilateral ethmoid air cells. Air-fluid
level within the left maxillary sinus.

Soft tissues: Large right frontal scalp to right periorbital and
maxillofacial soft tissue hematoma/laceration with soft tissue gas.

CT CERVICAL SPINE FINDINGS

Alignment: Trace C2-C3 grade 1 anterolisthesis. 3 mm C3-C4 grade 1
anterolisthesis.

Skull base and vertebrae: The basion-dental and atlanto-dental
intervals are maintained. Small nonaggressive appearing cystic
lesion within the base of the dens. Congenital versus degenerative
fusion of the C2-C3 articular pillars on the right. There is an
acute minimally displaced fracture of the C5 spinous process. Acute
minimally displaced fracture of the anterosuperior corner of the C7
vertebra. Acute mildly displaced fractures through the right C4
through C6 transverse processes/transverse foramina are questioned.
Age-indeterminate T1 superior endplate deformity.

Soft tissues and spinal canal: No prevertebral fluid or swelling. No
visible canal hematoma.

Disc levels: Cervical spondylosis with multilevel posterior
osteophytes. Multilevel disc height loss greatest at C5-C6. No
high-grade bony spinal canal stenosis.

Upper chest: Please refer to concurrent CT chest for description of
findings below the level of the thoracic inlet. These findings
include but are not limited to small bilateral pneumothoraces and
multiple rib fractures.

Findings of acute intracranial hemorrhage and multiple calvarial and
maxillofacial fractures discussed with Dr. Indhira by Dr. Zabie
at approximately [DATE] p.m. on 06/14/2019.
IMPRESSION: CT head:

Acute fractures of the right frontal bone, squamosal right temporal
bone and right sphenoid bone as detailed. The fractures of the right
sphenoid bone involve the right orbital apex. No definite
involvement of the right carotid canal.

Multiple small foci of pneumocephalus as well as trace extra-axial
hemorrhage adjacent to the right frontal calvarial fracture.

CT maxillofacial:

Extensive maxillofacial/right orbital fractures as detailed. Of
note, there are multiple nondisplaced fractures of the cribriform
plate bilaterally.

Comminuted fracture of the coronoid process of the right mandible.

Extensive right frontal scalp to right periorbital and maxillofacial
hematoma/laceration with associated subcutaneous gas.

CT cervical spine:

Acute minimally displaced C5 spinous process fracture.

Acute minimally displaced fracture of the anterosuperior corner of
the C7 vertebra.

Acute mildly displaced fractures through the right C4 through C6
transverse processes/transverse foramina are questioned.

Age-indeterminate T1 superior endplate deformity, which may reflect
an additional acute fracture.

Cervical spondylosis as described. 3 mm C3-C4 grade 1
anterolisthesis may be degenerative.

Please refer to concurrent CT chest for description of findings
below the level of the thoracic inlet. These findings include but
are not limited to small bilateral pneumothoraces and multiple rib
fractures.
